# Patient Record
Sex: Female | Born: 1975 | Hispanic: Yes | Marital: Married | Smoking: Never smoker
Health system: Southern US, Community
[De-identification: ages and names within clinical notes are randomized; demographics above are authoritative.]

## PROBLEM LIST (undated history)

## (undated) DIAGNOSIS — G43909 Migraine, unspecified, not intractable, without status migrainosus: Secondary | ICD-10-CM

## (undated) DIAGNOSIS — O139 Gestational [pregnancy-induced] hypertension without significant proteinuria, unspecified trimester: Secondary | ICD-10-CM

## (undated) DIAGNOSIS — E119 Type 2 diabetes mellitus without complications: Secondary | ICD-10-CM

## (undated) DIAGNOSIS — I10 Essential (primary) hypertension: Secondary | ICD-10-CM

## (undated) DIAGNOSIS — O24419 Gestational diabetes mellitus in pregnancy, unspecified control: Secondary | ICD-10-CM

## (undated) HISTORY — DX: Essential (primary) hypertension: I10

## (undated) HISTORY — DX: Gestational (pregnancy-induced) hypertension without significant proteinuria, unspecified trimester: O13.9

## (undated) HISTORY — DX: Gestational diabetes mellitus in pregnancy, unspecified control: O24.419

## (undated) HISTORY — DX: Migraine, unspecified, not intractable, without status migrainosus: G43.909

---

## 1999-12-24 ENCOUNTER — Other Ambulatory Visit: Admission: RE | Admit: 1999-12-24 | Discharge: 1999-12-24 | Payer: Self-pay | Admitting: Obstetrics

## 2010-10-07 NOTE — L&D Delivery Note (Signed)
Delivery Note At 10:03 PM a viable female was delivered via Vaginal, Spontaneous Delivery (Presentation: occiput anterior).  APGAR: 9, 9; weight 8 lb 8.7 oz (3875 g).   Placenta status: delivered spontaneously, intact.  Cord: 3 vessels with the following complications: None.    Anesthesia: Epidural  Episiotomy: none Lacerations: none Est. Blood Loss (mL): 300 mL  Mom to postpartum.  Baby to nursery-stable.  STINSON, JACOB JEHIEL 07/30/2011, 10:20 PM

## 2010-11-02 LAB — CYTOLOGY - PAP: Pap: NEGATIVE

## 2011-02-04 LAB — HEPATITIS B SURFACE ANTIGEN: Hepatitis B Surface Ag: NEGATIVE

## 2011-02-04 LAB — HIV ANTIBODY (ROUTINE TESTING W REFLEX): HIV: NONREACTIVE

## 2011-02-04 LAB — RPR: RPR: NONREACTIVE

## 2011-02-05 ENCOUNTER — Other Ambulatory Visit (HOSPITAL_COMMUNITY): Payer: Self-pay | Admitting: Obstetrics and Gynecology

## 2011-02-05 DIAGNOSIS — O352XX0 Maternal care for (suspected) hereditary disease in fetus, not applicable or unspecified: Secondary | ICD-10-CM

## 2011-02-05 DIAGNOSIS — Z0489 Encounter for examination and observation for other specified reasons: Secondary | ICD-10-CM

## 2011-03-12 ENCOUNTER — Ambulatory Visit (HOSPITAL_COMMUNITY)
Admission: RE | Admit: 2011-03-12 | Discharge: 2011-03-12 | Disposition: A | Discharge status: Home / Self Care | Payer: Self-pay | Source: Ambulatory Visit | Attending: Obstetrics and Gynecology | Admitting: Obstetrics and Gynecology

## 2011-03-12 ENCOUNTER — Other Ambulatory Visit (HOSPITAL_COMMUNITY): Payer: Self-pay | Admitting: Obstetrics and Gynecology

## 2011-03-12 ENCOUNTER — Encounter (HOSPITAL_COMMUNITY): Payer: Self-pay

## 2011-03-12 ENCOUNTER — Ambulatory Visit (HOSPITAL_COMMUNITY)
Admission: RE | Admit: 2011-03-12 | Discharge: 2011-03-12 | Disposition: A | Discharge status: Home / Self Care | Payer: Self-pay | Source: Ambulatory Visit | Attending: *Deleted | Admitting: *Deleted

## 2011-03-12 DIAGNOSIS — O352XX0 Maternal care for (suspected) hereditary disease in fetus, not applicable or unspecified: Secondary | ICD-10-CM

## 2011-03-12 DIAGNOSIS — Z3689 Encounter for other specified antenatal screening: Secondary | ICD-10-CM | POA: Insufficient documentation

## 2011-03-12 DIAGNOSIS — E669 Obesity, unspecified: Secondary | ICD-10-CM | POA: Insufficient documentation

## 2011-03-12 DIAGNOSIS — O09299 Supervision of pregnancy with other poor reproductive or obstetric history, unspecified trimester: Secondary | ICD-10-CM | POA: Insufficient documentation

## 2011-03-12 DIAGNOSIS — O2690 Pregnancy related conditions, unspecified, unspecified trimester: Secondary | ICD-10-CM

## 2011-03-12 DIAGNOSIS — Z0489 Encounter for examination and observation for other specified reasons: Secondary | ICD-10-CM

## 2011-04-12 ENCOUNTER — Ambulatory Visit (HOSPITAL_COMMUNITY)
Admission: RE | Admit: 2011-04-12 | Discharge: 2011-04-12 | Disposition: A | Discharge status: Home / Self Care | Payer: Self-pay | Source: Ambulatory Visit | Attending: Obstetrics and Gynecology | Admitting: Obstetrics and Gynecology

## 2011-04-12 ENCOUNTER — Other Ambulatory Visit (HOSPITAL_COMMUNITY): Payer: Self-pay | Admitting: Obstetrics and Gynecology

## 2011-04-12 DIAGNOSIS — O09299 Supervision of pregnancy with other poor reproductive or obstetric history, unspecified trimester: Secondary | ICD-10-CM | POA: Insufficient documentation

## 2011-04-12 DIAGNOSIS — O2690 Pregnancy related conditions, unspecified, unspecified trimester: Secondary | ICD-10-CM

## 2011-04-12 DIAGNOSIS — E669 Obesity, unspecified: Secondary | ICD-10-CM | POA: Insufficient documentation

## 2011-04-12 DIAGNOSIS — O99210 Obesity complicating pregnancy, unspecified trimester: Secondary | ICD-10-CM | POA: Insufficient documentation

## 2011-05-14 DIAGNOSIS — F5089 Other specified eating disorder: Secondary | ICD-10-CM | POA: Insufficient documentation

## 2011-05-14 LAB — HEMOGLOBIN
ABO/RH(D): A POS
Ab Scrn Ob: NEGATIVE
GC Probe Amp, Genital: NEGATIVE
Glucose, 1 Hour GTT: 105
HIV: NEGATIVE
Hgb electrophoresis, blood: NORMAL
Rubella: IMMUNE
VZ IgM Titer Interp: IMMUNE

## 2011-05-16 ENCOUNTER — Ambulatory Visit (INDEPENDENT_AMBULATORY_CARE_PROVIDER_SITE_OTHER): Payer: Self-pay | Admitting: Family Medicine

## 2011-05-16 ENCOUNTER — Encounter: Payer: Self-pay | Admitting: Family Medicine

## 2011-05-16 VITALS — BP 122/78 | Temp 97.4°F | Ht 62.5 in | Wt 249.2 lb

## 2011-05-16 DIAGNOSIS — IMO0002 Reserved for concepts with insufficient information to code with codable children: Secondary | ICD-10-CM

## 2011-05-16 DIAGNOSIS — Z3200 Encounter for pregnancy test, result unknown: Secondary | ICD-10-CM

## 2011-05-16 DIAGNOSIS — O10019 Pre-existing essential hypertension complicating pregnancy, unspecified trimester: Secondary | ICD-10-CM

## 2011-05-16 DIAGNOSIS — F5089 Other specified eating disorder: Secondary | ICD-10-CM

## 2011-05-16 DIAGNOSIS — O169 Unspecified maternal hypertension, unspecified trimester: Secondary | ICD-10-CM | POA: Insufficient documentation

## 2011-05-16 LAB — POCT URINALYSIS DIP (DEVICE)
Bilirubin Urine: NEGATIVE
Ketones, ur: NEGATIVE mg/dL
Protein, ur: NEGATIVE mg/dL
Specific Gravity, Urine: 1.01 (ref 1.005–1.030)

## 2011-05-16 NOTE — Patient Instructions (Addendum)
Hipertensin en el embarazo Presin Arterial Elevada Durante el Embarazo (Hypertension In Pregnancy) Puede presentar presin alta (hipertensin) en la ltima mitad del embarazo (20 semanas o ms). Esto ocurre an si su presin arterial hubiera sido normal antes del embarazo. La presin alta en el embarazo puede ser seal de un trastorno llamado preeclampsia. Esto se percibe especialmente si presenta protenas en la orina. La preeclampsia puede no dar sntomas y hallarse slo en los exmenes de laboratorio y en el examen fsico. Si progresa, puede volverse eclampsia (convulsiones) o Sndrome de HELLP (anormalidades del hgado, problemas de coagulacin, mayor aumento de la presin, aumento de protenas en la orina). Ambas son enfermedades graves y Crissie Reese para la vida de la madre y del beb. Uana mujer que tiene presin alta antes de quedar embarazada tambin puede tener preclampsia sobreimpuesta. SOLICITE ATENCIN MDICA SI:  Dolor de estmago.   Hinchazn repentina y QUALCOMM, tobillos, o cara.   Aumenta de peso de 4 libras (1,8 kg) en una semana.   Vomita repetidas veces.   Sufre una hemorragia vaginal.   No percibe los movimientos del beb.   Dolor de [place].   Problemas en la visin (visin borrosa o doble).   Movimientos o espasmos musculares.   Falta de aire.   Labios o uas azulados.   Protenas en la orina.  Document Released: 09/23/2005 Document Re-Released: 03/13/2010 [facility] Patient Information 2011 [place], [place]. Psychiatrist Glass blower/designer trimestre) (Pregnancy - Third Trimester) El tercer trimestre del embarazo (los ltimos 3 meses) es el perodo de cambios ms rpidos que atraviesan usted y el beb. El aumento de peso es ms rpido. El beb alcanza un largo de aproximadamente 50 cm (20 pulgadas) y pesa entre 2,700 y 4,500 kg (6 a 10 libras). El beb gana ms tejido graso y ya est listo para la vida fuera del cuerpo de la [place]. Mientras estn en el interior,  los bebs tienen perodos de sueo y vigilia, Warehouse manager y tienen hipo. Quizs sienta pequeas contracciones del tero. Este es el falso trabajo de [place]. Tambin se las conoce como contracciones de Braxton-Hicks. Es como una prctica del parto. Los problemas ms habituales de esta etapa del embarazo incluyen mayor dificultad para respirar, hinchazn de las manos y los pies por retencin de lquidos y la necesidad de Geographical information systems officer con ms frecuencia debido a que el tero y el beb presionan sobre la vejiga.  EXAMENES PRENATALES  Durante los Manpower Inc, deber seguir realizando pruebas de [place], segn avance el [place]. Estas pruebas se realizan para controlar su salud y la del beb. Tambin se realizan anlisis de sangre para [employer] niveles de [place]. La anemia (bajo nivel de hemoglobina) es frecuente durante el embarazo. Para prevenirla, se administran hierro y vitaminas. Tambin le harn nuevas pruebas para descartar la diabetes. Podrn repetirle algunas de las Hovnanian Enterprises hicieron previamente.   En cada visita le medirn el tamao del tero. Es para asegurarse de que el beb se desarrolla correctamente.   Tambin en cada visita la pesarn. Esto se realiza para asegurarse de que aumenta de peso al ritmo indicado y que usted y su beb evolucionan normalmente.   En algunas ocasiones se realiza una ecografa para confirmar el correcto desarrollo y evolucin del beb. Esta prueba se realiza con ondas sonoras inofensivas para el beb, de modo que el profesional pueda calcular con ms precisin la fecha del [place].   Discuta las posibilidades de la anestesia si necesita cesrea.  Algunas veces  se realizan pruebas especializadas del lquido amnitico que rodea al beb. Esta prueba se denomina amniocentesis. El lquido amnitico se obtiene introduciendo una aguja en el abdomen (vientre). En ocasiones se lleva a cabo cerca del final del embarazo, si es Optician, dispensing. En  este caso se realiza para asegurarse de que los pulmones del beb estn lo suficientemente maduros como para que pueda vivir fuera del tero. CAMBIOS QUE OCURREN EN EL TERCER TRIMESTRE DEL EMBARAZO Su organismo atravesar diferentes cambios durante el embarazo que varan de Neomia Dear persona a Educational psychologist. Converse con el profesional que la asiste acerca los cambios que usted note y que la preocupen.  Durante el ltimo trimestre probablemente sienta un aumento del apetito. Es normal tener "antojos" de Development worker, community. Esto vara de Neomia Dear persona a otra y de un embarazo a Therapist, art.   Podrn aparecer las primeras estras en las caderas, abdomen y [place]. Estos son cambios normales del cuerpo durante el [place]. No existen medicamentos ni ejercicios que puedan prevenir [employer]Max.   El estreimiento puede tratarse con un laxante o agregando fibra a su dieta. Beber grandes cantidades de lquidos, tomar fibras en forma de verduras, frutas y granos integrales es de [place].   Tambin es beneficioso practicar actividad fsica. Si ha sido una persona Engineer, mining, podr continuar con la Harley[employer]Davidson de las actividades durante el mismo. Si ha sido [employer], puede ser beneficioso que comience con un programa de ejercicios, Museum/gallery exhibitions officer. Consulte con el profesional que la asiste antes de comenzar un programa de ejercicios.   Evite el consumo de cigarrillos, el alcohol, los medicamentos no prescritos y las "drogas de la calle" durante el [place]. Estas sustancias qumicas afectan la formacin y el desarrollo del beb. Evite estas sustancias durante todo el embarazo para asegurar el nacimiento de un beb sano.   Dolor de espalda, venas varicosas y hemorroides podran aparecer o empeorar.   Los movimientos del beb pueden ser ms bruscos y aparecer ms a menudo.   Puede que note dificultades para respirar facilmente.   El ombligo podra salrsele hacia afuera.   Puede segregar un lquido  amarillento (calostro) de las [place].   Puede segregar mucus con sangre. Esto normalmente ocurre unos [address] a una semana antes de que comience el [place] de [place].  INSTRUCCIONES PARA EL CUIDADO DOMICILIARIO  La mayor parte de los cuidados que se aconsejan son los mismos que los indicados para las primeras etapas del Psychiatrist. Es importante que concurra a todas las citas con el profesional y siga sus instrucciones con Camera operator a los medicamentos que deba Chemical engineer, a la actividad fsica y a Psychologist, forensic.   Durante el embarazo debe obtener nutrientes para usted y para su beb. Consuma alimentos balanceados a intervalos regulares. Elija alimentos como carne, pescado, [place] y otros productos lcteos descremados, verduras, frutas, panes integrales y cereales. El Equities trader cul es el aumento de peso ideal.   Las relaciones sexuales pueden continuarse hasta casi el final del embarazo, si no se presentan otros problemas como prdida prematura (antes de tiempo) de lquido amnitico, hemorragia vaginal o dolor abdominal (en el vientre).   Realice Tesoro Corporation, si no tiene restricciones. Consulte con el profesional que la asiste si no sabe con certeza si determinados ejercicios son seguros. El mayor aumento de peso se produce Foot [employer]ocker ltimos trimestres del [place].   Haga reposo con frecuencia, con las piernas elevadas, o segn lo necesite para evitar  los calambres y Chief Technology Officer de cintura.   Use un buen sostn o como los que se usan para hacer deportes para Paramedic la sensibilidad de las [place]. Tambin puede serle til si lo [place] mientras duerme. Si pierde Product manager, podr Parker Hann[employer]in.   No utilice la baera con agua caliente, baos turcos y saunas.   Colquese el cinturn de seguridad cuando conduzca. Este la proteger a usted y al beb en caso de accidente.   Evite comer carne cruda y el contacto con los utensilios y desperdicios de los gatos. Estos  elementos contienen grmenes que pueden causar defectos de nacimiento en el beb.   Es fcil perder algo de orina durante el [place]. Apretar y Chief Operating Officer los msculos de la pelvis la ayudar con este problema. Practique detener la miccin cuando est en el bao. Estos son los mismos msculos que Development worker, international aid. Son [employer]PP[employer] Partners mismos msculos que utiliza cuando trata de [employer]der System gases. Puede practicar apretando estos msculos WellPoint, y repetir esto tres veces por da aproximadamente. Una vez que conozca qu msculos debe contraer, no realice estos ejercicios durante la miccin. Puede favorecerle una infeccin si la orina vuelve hacia atrs.   Pida ayuda si tiene necesidades econmicas, de asesoramiento o nutricionales durante el [place]. El profesional podr ayudarla con respecto a estas necesidades, o derivarla a otros especialistas.   Practique la ida [employer] hospital a modo de [place].   Tome clases prenatales junto con su pareja para comprender, practicar y hacer preguntas acerca del Aleen Campi de parto y el nacimiento.   Prepare la habitacin del beb.   No viaje fuera de la ciudad a menos que sea absolutamente necesario y con el consejo del mdico.   Use slo zapatos bajos sin taco para tener un mejor equilibrio y prevenir cadas.  EL CONSUMO DE MEDICAMENTOS Y DROGAS DURANTE EL EMBARAZO  Contine tomando las vitaminas apropiadas para esta etapa tal como se le indic. Las vitaminas deben contener un miligramo de cido flico y deben suplementarse con hierro. Guarde todas las vitaminas fuera del alcance de los nios. La ingestin de slo un par de vitaminas o comprimidos que contengan hierro pueden ocasionar la Newmont Mining en un beb o en un nio pequeo.   Evite el uso de [place], inclusive los de venta [place], que no hayan sido prescritos o indicados por el profesional que la asiste. Algunos medicamentos pueden causar problemas fsicos al beb. Utilice los medicamentos de venta  libre o de prescripcin para Chief Technology Officer, Environmental health practitioner o la [place], segn se lo indique el profesional que lo asiste. No utilice aspirina, ibuprofeno (Motrin, Advil, Nuprin) o naproxeno (Aleve) a menos que el profesional la autorice.   El alcohol se asocia a cierto nmero de defectos del nacimiento, incluido el sndrome de alcoholismo fetal. Debe evitar el consumo de alcohol en cualquiera de sus formas. El cigarrillo causa nacimientos prematuros y bebs de bajo peso al nacer. Las drogas de la calle son muy nocivas para el beb y estn absolutamente prohibidas. Un beb que nace de American Express, ser adicto al nacer. Ese beb tendr los mismos sntomas de abstinencia que un adulto.   Infrmele al profesional si consume alguna droga.  SOLICITE ATENCIN MDICA SI: Tiene alguna preocupacin Academic librarian. Es mejor que llame para formular las preguntas si no puede esperar hasta la prxima visita, que sentirse preocupada por ellas.  DECISIONES ACERCA DE LA CIRCUNCISIN Usted puede saber o no cul es el sexo de  su beb. Si es un varn, ste es el momento de pensar acerca de la circuncisin. La circuncisin es la extirpacin del prepucio. Esta es la piel que cubre el extremo sensible del pene. No hay un motivo mdico que lo justifique. Generalmente la decisin se toma segn lo que sea popular en ese momento, o se basa en creencias religiosas. Podr conversar estos temas con el profesional que la asiste. SOLICITE ATENCIN MDICA DE INMEDIATO SI:  La temperatura oral se eleva sin motivo por encima de 101 o segn le indique el profesional que la asiste.   Tiene una prdida de lquido por la vagina (canal de parto). Si sospecha una ruptura de las [place], tmese la temperatura y llame al profesional para informarlo sobre esto.   Observa unas pequeas manchas, una hemorragia vaginal o elimina cogulos. Avsele al profesional acerca de la cantidad y de cuntos apsitos est utilizando.   Presenta un  olor desagradable en la secrecin vaginal y observa un cambio en el color, de transparente a blanco.   Ha vomitado durante ms de 24 horas.   Presenta escalofros o fiebre.   Comienza a sentir falta de aire.   Siente ardor al Beatrix Shipper.   [place] o sube ms de 900 g (ms de 2 libras), o segn lo indicado por el profesional que la asiste. Observa que sbitamente se le hinchan el rostro, las manos, los pies o las piernas.   Presenta dolor abdominal. Las molestias en el ligamento redondo son Neomia Dear causa benigna (no cancerosa) frecuente de Engineer, mining abdominal durante el Psychiatrist, pero el profesional que la asiste deber evaluarlo.   Presenta dolor de cabeza intenso que no se [place].   Si no siente los movimientos del beb durante ms de tres horas. Si piensa que el beb no se mueve tanto como lo haca habitualmente, coma algo que Psychologist, clinical y Target Corporation lado izquierdo durante [place]. El beb debe moverse al menos 4  5 veces por hora. Comunquese inmediatamente si el beb se mueve menos que lo indicado.   Se cae, se ve involucrada en un accidente automovilstico o sufre algn tipo de traumatismo.   En su hogar hay violencia mental o fsica.  Document Released: 07/03/2005 Document Re-Released: 07/21/2009 [facility] Patient Information 2011 [place], [place]. Eleccin del mtodo anticonceptivo (Birth Recruitment consultant) Los anticonceptivos son mtodos, prcticas o dispositivos para evitar que se Facilities manager en una mujer sexualmente [place].  A continuacin se indican algunos mtodos para evitar el embarazo.  Abstinencia es el mtodo ms seguro para el control de la natalidad. Requiere autocontrol. No hay riesgo de contraer enfermedades de transmisin sexual o sida.   Abstinencia peridica requiere autocontrol en ciertos perodos del mes.   Mtodo calendario, teniendo en cuenta el momento de sus perodos menstruales todos los meses.   El mtodo de ovulacin es Administrator, Civil Service relaciones  sexuales en la poca en la que est ovulando (formando un vulo).   El mtodo simptotrmico es Administrator, Civil Service relaciones sexuales en la poca en la que est ovulando con la utilizacin de un termmetro y los sntomas de la ovulacin.   El mtodo de postovulacin es tener relaciones sexuales despus de la ovulacin.  Estos mtodos no protegen contra las infecciones transmitidas sexualmente ni el sida.  Las pldoras anticonceptivas contienen estrgenos y Education officer, museum. Estos medicamentos actan impidiendo la ovulacin (la liberacin del huevo del ovario). El mdico prescribir pldoras anticonceptivas y le har preguntas acerca de los riesgos de tomarlas. No protege contra las infecciones  transmitidas sexualmente ni el sida.   La "minipldora" slo contiene progesterona. Deben tomarse todos los [address] del mes y debe prescribirlas el mdico. No protege contra las infecciones de transmisin sexual ni el sida.   Los anticonceptivos de Associate Professor, tambin llamados "la pldora del da despus" La pldora puede tomarse inmediatamente despus de Warehouse manager relaciones sexuales hasta [address] despus, si piensa que su mtodo anticonceptivo ha [place], o fue forzada a Doctor, hospital. Es ms efectiva si se toma poco tiempo despus. No use los anticonceptivos de emergencia como nico mtodo anticonceptivo. Los anticonceptivos de emergencia estn disponibles sin prescripcin mdica. Consltelo con su farmacutico.   Los condones son una vaina delgada de ltex, material sinttico o piel de cordero que se usan en el pene durante el acto sexual. Cristie Hem un esperimicida incorporado. Los condones de ltex Midwife y las enfermedades de transmisin sexual. Los condones "naturales" o de piel de cordero Development worker, international aid no protegen contra las enfermedades de transmisin sexual ni el sida.   Los condones femeninos son una vaina blanda y que se adaptan suavemente a la vagina antes de las Clinical research associate. Pueden  evitar Firefighter y las enfermedades de transmisin sexual, inclusive el sida,   La esponja es una pieza de poliuretano circular suave con espermicida que se inserta en la vagina luego de humedecerla y antes de Management consultant. No requiere prescripcin mdica. No protege contra las infecciones transmitidas sexualmente ni el sida.   El diafragma es una barrera de ltex redonda y Casimer Bilis que debe ser recomendado por un profesional. Se inserta en la vagina, junto con un gel espermicida. Luego de prepararlo, insrtelo antes de las [employer]. Debe dejar el diafragma colocado en la vagina durante 6 a 8 horas. La eliminacin y reinsercin siempre debe realizarse con un espermicida. No protege contra las infecciones transmitidas sexualmente ni el sida.   Las inyecciones de hormonas se administran cada 3 meses como mtodo anticonceptivo. Estas inyecciones contienen progesterona sinttica y no contienen estrgenos, Esta hormona impide que los ovarios liberen vulos. Tambin hacen que el moco cervical se espese y modifique el tejido de recubrimiento interno del tero. Esto hace ms difcil que los espermatozoides sobrevivan en el tero. No protege contra las infecciones transmitidas sexualmente ni el sida.   [place] para el control de la natalidad (Ortho-Evra)- Contiene hormonas similares a las de las pldoras, de modo que la efectividad, los riesgos y los efectos secundarios son los mismos. Deben cambiarse una vez por semana y se utilizan bajo prescripcin mdica. Es menos efectivo en las mujeres con sobrepeso. No protege contra las infecciones transmitidas sexualmente ni el sida.   Anillo vaginal (NuvaRing) contiene hormonas similares a las que contienen las pldoras anticonceptivas. Se deja colocado durante tres semanas, se lo retira durante una semana y luego se coloca uno nuevo. Trae un timer para colocar en el bolsillo y recordar cundo debe retirarlo y colocarse uno nuevo. Es necesario un  examen previo y la prescripcin del mdico, al igual que con las pldoras y Event organiser. No protege contra las infecciones transmitidas sexualmente ni el sida.   Lunelle es una inyeccin de estrgenos y progesterona que se administra cada 28 a 30 das. Puede aplicarse en el brazo, muslo o nalgas. No protege contra las infecciones transmitidas sexualmente o el sida.   Dispositivo intrauterino (DIU): T de cobre o T con progesterona es un dispositivo con forma de T que se coloca en el tero de la mujer durante  el perodo menstrual, para Neurosurgeon. La T de cobre dura 10 aos y el dispositivo de progesterona puede durar 5 aos. El DIU de progesterona tambin puede ayudar a Chief Operating Officer los perodos menstruales abundantes. No protege contra las infecciones transmitidas sexualmente o el sida. El DIU de T de cobre se puede Chemical engineer como dispositivo de Associate Professor, si se inserta dentro de los [address] de tener relaciones sexuales sin proteccin.   El capuchn cervical es una barrera de ltex o taza plstica redonda y [place] que cubre el cuello del tero y debe ser colocada por un mdico. No necesitar Chemical engineer un espermicida para colocarlo o retirarlo cada vez que tiene Clinical research associate. No protege contra las infecciones transmitidas sexualmente ni el sida.   Los espermicidas son qumicos que matan o bloquean el esperma y no lo dejan ingresar al cuello del tero y al tero. Vienen en forma de cremas, geles, supositorios, espuma o pastillas, y no requieren prescripcin. Se insertan en la vagina con un aplicador antes de Management consultant. Esto debe repetirse cada vez que tiene [employer].   El retiro es un mtodo en el que el hombre retira el pene durante las relaciones sexuales antes de llegar al clmax y depositar el esperma. No protege contra las infecciones transmitidas sexualmente o el sida.   La ligadura de trompas en la mujer se realiza sellando quirrgicamente las trompas de [place]  lo que impide que el vulo descienda hacia el tero. No protege contra las infecciones transmitidas sexualmente ni el sida.   La esterilizacin masculina es cuando al hombre se le atan los conductos quirrgicamente (vasectoma) para que el esperma no ingrese a la vagina durante las Clinical research associate. No protege contra las infecciones transmitidas sexualmente ni el sida.  Independientemente del mtodo anticonceptivo que usted elija, es importante que utilice alguna forma de proteccin contra las infecciones que se transmiten sexualmente. Document Released: 09/23/2005 Document Re-Released: 03/13/2010 [facility] Patient Information 2011 [place], [place]. Amamantar al beb (Breastfeeding Your Baby) LOS BENEFICIOS DE AMAMANTAR Para el beb  La primera leche (calostro ) ayuda al mejor funcionamiento del sistema digestivo del beb.   La leche tiene anticuerpos que provienen de la madre y que ayudan a prevenir las infecciones en el beb.   Hay una menor incidencia de asma, enfermedades alrgicas y SMSI (sndrome de muerte sbita nfantil).   Los nutrientes que contiene la [place] materna son mejores que las frmulas para el bibern y favorecen el desarrollo cerebral.   Los bebs amamantados sufren menos gases, clicos y constipacin.  Para la mam  La lactancia materna favorece el desarrollo de un vnculo muy especial entre la madre y el beb.   Es ms conveniente, siempre disponible a la Optician, dispensing y ms econmica que la [employer].   Consume caloras en la madre y la ayuda a perder el peso ganado durante el [place].   Favorece la contraccin del tero a su tamao normal, de manera ms rpida y [employer] las hemorragias luego del [place].   Las [employer].C[employer] Holdings que amamantan tienen menor riesgo de Geophysical data processor de mama.  AMAMNTELO CON FRECUENCIA  Un beb sano, nacido a trmino, puede amamantarse con tanta frecuencia como cada hora, o espaciar las comidas cada tres horas.   Esta  frecuencia variar de un beb a otro. Observe al beb cuando manifieste signos de hambre, antes que regirse por el reloj.   Amamntelo tan seguido como el beb lo solicite, o cuando usted sienta la necesidad de Paramedic sus  mamas.   Despierte al beb si han pasado 3  4 horas desde la ltima comida.   El amamantamiento frecuente la ayudar a producir ms [place] y a Education officer, community de Engineer, mining en los pezones e hinchazn de las [place].  LA POSICIN DEL BEB PARA AMAMANTARLO  Ya sea que se encuentre acostada o sentada, asegrese que el abdomen del beb enfrente el suyo.   Sostenga la mama con el pulgar por arriba y el resto de los dedos por debajo. Asegrese que sus dedos se encuentren lejos del pezn y de la boca del beb.   Toque suavemente los labios del beb y la mejilla ms cercana a la mama con el dedo o el pezn.   Cuando la boca del beb se abra lo suficiente, introduzca el pezn y la zona oscura que lo rodea tanto como le sea posible dentro de la boca.   Coloque a beb cerca suyo de modo que su nariz y mejillas toquen las mamas al [employer].  LAS COMIDAS  La duracin de cada comida vara de un beb a otro y de [place] comida a [place].   El beb debe succionar alrededor American Financial o tres minutos para que le llegue [place]. Esto se denomina "bajada". Por este motivo, permita que el nio se alimente en cada mama todo lo que desee. Terminar de mamar cuando haya recibido la cantidad [place] de nutrientes.   Para detener la succin coloque su dedo en la comisura de la boca del nio y Midwife entre sus encas antes de quitarle la mama de la boca. Esto la ayudar a English as a second language teacher.  REDUCIR LA CONGESTIN DE LAS MAMAS  Durante la primera semana despus del parto, usted puede experimentar Monsanto Company. Cuando las mamas estn congestionadas, se sienten calientes, llenas y molestas al tacto. Puede reducir la congestin si:   Lo amamanta frecuentemente, cada 2-3 horas. Las mams que  [employer] pronto y con frecuencia tienen menos problemas de [place].   Coloque bolsas fras livianas (las bolsas de maz o guisantes congelados son tiles) entre cada [place]. Esto ayuda a Building services engineer. Envuelva las bolsas de hielo en una toalla liviana para proteger su piel.   Aplique compresas hmedas calientes [employer] durante 5 a 10 minutos antes de amamantar al [employer]. Esto aumenta la circulacin y [place] a que la [place].   Masajee suavemente la mama antes y Psychologist, sport and exercise.   Asegrese que el nio vaca al menos una mama antes de cambiar de lado.   Use un sacaleche para vaciar la mama si el beb se duerme o no se alimenta bien. Tambin podr P[employer] con esta bomba si tiene que volver al trabajo o siente que las mamas estn congestionadas.   Evite los biberones, chupetes o complementar la alimentacin con agua o jugos en lugar de la [place].   Verifique que el beb se encuentra en la posicin correcta mientras lo alimenta.   Evite el cansancio, el estrs y la anemia   Use un soutien que sostenga bien sus mamas y evite los que tienen aro.   Consuma una dieta balanceada y beba lquidos en cantidad.  Si sigue estas indicaciones, la congestin debe mejorar en 24 a 48 horas. Si an tiene dificultades, consulte a Barista. TENDR SUFICIENTE LECHE MI BEB? Algunas veces las madres se preocupan acerca de si sus bebs tendrn la leche suficiente. Puede asegurarse que el beb tiene la [place]  suficiente si:  El beb succiona y escucha que traga activamente.   El nio se alimenta al menos 8 a 12 veces en 24 horas. Alimntelo hasta que se desprenda por sus propios medios o se quede dormido en la primera mama (al menos durante 10 a 20 minutos), luego ofrzcale el otro lado.   El beb moja 5 a 6 paales descartables (6 a 8 paales de tela) en 24 horas cuando tiene 5  6 das de vida.   Tiene al menos 2-3 deposiciones todos los [employer]  primeros meses. La leche materna es todo el alimento que el beb necesita. No es necesario que el nio ingiera agua o preparados de bibern. De hecho, para ayudar a que sus mamas produzcan ms [place], lo mejor es no darle al beb suplementos durante las primeras semanas.   La materia fecal debe ser blanda y [place].   El beb debe aumentar 4 a 6 libras (120 gr. a 170 gr. por semana.  CUDESE Cuide sus mamas del siguiente modo:  Bese o dchese diariamente.   No lave sus pezones con jabn.   Comience a amamantar del lado izquierdo en una comida y del lado derecho en la siguiente.   Notar que [employer] suministro de [place] a los 2 a 5 [address]  [address] despus del [place]. Puede sentir algunas molestias por la congestin, lo que hace que sus mamas estn duras y sensibles. La congestin disminuye en 24 a 48 horas. Mientras tanto, aplique toallas hmedas calientes durante 5 a 10 minutos antes de amamantar. Un masaje suave y la extraccin de un poco de leche antes de Museum/gallery exhibitions officer ablandarn las mamas y har ms fcil que el beb se agarre. Use un buen sostn y seque al aire los pezones durante 10 a 15 minutos luego de cada alimentacin.   Solo utilice apsitos de algodn.   Utilice lanolina [employer] pezones luego de [place]. No necesita lavarlos luego de alimentar al [employer].  Cudese del siguiente modo:   Consuma alimentos bien balanceados y refrigerios nutritivos.   Dixie Dials, jugos de fruta y agua para Warehouse manager sed (alrededor de 8 vasos por Futures trader).   Descanse lo suficiente.   Aumente la ingesta de calcio en la dieta (1200mg /da).   Evite los alimentos que usted nota que puedan afectar al beb.  SOLICITE ATENCIN MDICA SI:  Tiene preguntas que formular o dificultades con la alimentacin a pecho.   Necesita ayuda.   Observa una zona dura, roja y que le duele en la zona de la mama, y se acompaa de fiebre de 100.5 F (38.1 C) o ms.   El beb est muy somnoliento como para alimentarse  bien o tiene problemas para dormir.   El beb moja menos de 6 paales por da, a partir de los [address] de [place].   La piel del beb o la parte blanca de sus ojos est ms amarilla de lo que estaba en el hospital.   Se siente deprimida.  Document Released: 09/23/2005 Document Re-Released: 12/18/2009 [facility] Patient Information 2011 [place], [place].

## 2011-05-16 NOTE — Progress Notes (Signed)
  Subjective:    Jeanette [name] is a 35 y.o. G3P2002 [redacted]w[redacted]d being seen today for her obstetrical visit.  Patient reports no complaints. Fetal movement: normal.  She is referred from [facility] for HTN.     Objective:    BP 122/78  Temp 97.4 F (36.3 C)  Ht 5' 2.5" (1.588 m)  Wt 249 lb 3.2 oz (113.036 kg)  BMI 44.85 kg/m2  LMP 10/30/2010  Physical Exam  Exam  FHT:  135 BPM  Uterine Size: 31 cm  Presentation: unsure     Assessment:    Pregnancy:  [id number] HTN     Plan:    Patient Active Problem List  Diagnoses  . Pica  . Hypertension complicating pregnancy  U/S growth   Follow up in 2 Weeks.

## 2011-05-16 NOTE — Progress Notes (Signed)
Pt has already had 28 week labs at HD in [facility]. Pt thinks she has a yeast infection, having thick white discharge. Used spanish interpreter [employer]

## 2011-05-24 ENCOUNTER — Ambulatory Visit (HOSPITAL_COMMUNITY)
Admission: RE | Admit: 2011-05-24 | Discharge: 2011-05-24 | Disposition: A | Discharge status: Home / Self Care | Payer: Self-pay | Source: Ambulatory Visit | Attending: Obstetrics and Gynecology | Admitting: Obstetrics and Gynecology

## 2011-05-24 ENCOUNTER — Other Ambulatory Visit (HOSPITAL_COMMUNITY): Payer: Self-pay | Admitting: Obstetrics and Gynecology

## 2011-05-24 DIAGNOSIS — O09299 Supervision of pregnancy with other poor reproductive or obstetric history, unspecified trimester: Secondary | ICD-10-CM | POA: Insufficient documentation

## 2011-05-24 DIAGNOSIS — E669 Obesity, unspecified: Secondary | ICD-10-CM | POA: Insufficient documentation

## 2011-05-24 DIAGNOSIS — O2690 Pregnancy related conditions, unspecified, unspecified trimester: Secondary | ICD-10-CM

## 2011-05-24 DIAGNOSIS — O99210 Obesity complicating pregnancy, unspecified trimester: Secondary | ICD-10-CM | POA: Insufficient documentation

## 2011-05-24 NOTE — Progress Notes (Signed)
Patient seen for ultrasound only appointment today.  Please see [facility]-OBGY[facility] report for details.  

## 2011-05-30 ENCOUNTER — Ambulatory Visit (INDEPENDENT_AMBULATORY_CARE_PROVIDER_SITE_OTHER): Payer: Self-pay | Admitting: Family Medicine

## 2011-05-30 VITALS — BP 114/78 | Temp 97.3°F | Wt 252.8 lb

## 2011-05-30 DIAGNOSIS — F5089 Other specified eating disorder: Secondary | ICD-10-CM

## 2011-05-30 DIAGNOSIS — O0990 Supervision of high risk pregnancy, unspecified, unspecified trimester: Secondary | ICD-10-CM

## 2011-05-30 DIAGNOSIS — O169 Unspecified maternal hypertension, unspecified trimester: Secondary | ICD-10-CM

## 2011-05-30 LAB — POCT URINALYSIS DIP (DEVICE)
Bilirubin Urine: NEGATIVE
Nitrite: NEGATIVE
pH: 6 (ref 5.0–8.0)

## 2011-05-30 NOTE — Progress Notes (Signed)
Pt without complaint.  Denies frequency, hesitancy, dysuria, headache, vision changes.  No vagina bleeding.  Will send urine culture for large leukocytes.  F/u in 2 weeks.

## 2011-05-30 NOTE — Progress Notes (Signed)
No vaginal discharge. 

## 2011-06-01 LAB — URINE CULTURE

## 2011-06-13 ENCOUNTER — Encounter: Payer: Self-pay | Admitting: Family Medicine

## 2011-06-13 ENCOUNTER — Ambulatory Visit (INDEPENDENT_AMBULATORY_CARE_PROVIDER_SITE_OTHER): Payer: Self-pay | Admitting: Family Medicine

## 2011-06-13 DIAGNOSIS — O10019 Pre-existing essential hypertension complicating pregnancy, unspecified trimester: Secondary | ICD-10-CM

## 2011-06-13 DIAGNOSIS — O169 Unspecified maternal hypertension, unspecified trimester: Secondary | ICD-10-CM

## 2011-06-13 DIAGNOSIS — F5089 Other specified eating disorder: Secondary | ICD-10-CM

## 2011-06-13 DIAGNOSIS — G43909 Migraine, unspecified, not intractable, without status migrainosus: Secondary | ICD-10-CM | POA: Insufficient documentation

## 2011-06-13 HISTORY — DX: Migraine, unspecified, not intractable, without status migrainosus: G43.909

## 2011-06-13 LAB — POCT URINALYSIS DIP (DEVICE)
Bilirubin Urine: NEGATIVE
Glucose, UA: NEGATIVE mg/dL
Ketones, ur: NEGATIVE mg/dL
pH: 7 (ref 5.0–8.0)

## 2011-06-13 MED ORDER — CYCLOBENZAPRINE HCL 5 MG PO TABS: 5.0000 mg | ORAL_TABLET | Freq: Three times a day (TID) | ORAL | Status: DC | PRN

## 2011-06-13 NOTE — Patient Instructions (Signed)
Hipertensin en el embarazo Presin Arterial Elevada Durante el Embarazo (Hypertension In Pregnancy) Puede presentar presin alta (hipertensin) en la ltima mitad del embarazo (20 semanas o ms). Esto ocurre an si su presin arterial hubiera sido normal antes del embarazo. La presin alta en el embarazo puede ser seal de un trastorno llamado preeclampsia. Esto se percibe especialmente si presenta protenas en la orina. La preeclampsia puede no dar sntomas y hallarse slo en los exmenes de laboratorio y en el examen fsico. Si progresa, puede volverse eclampsia (convulsiones) o Sndrome de HELLP (anormalidades del hgado, problemas de coagulacin, mayor aumento de la presin, aumento de protenas en la orina). Ambas son enfermedades graves y Crissie Reese para la vida de la madre y del beb. Uana mujer que tiene presin alta antes de quedar embarazada tambin puede tener preclampsia sobreimpuesta. SOLICITE ATENCIN MDICA SI:  Dolor de estmago.   Hinchazn repentina y [employer], tobillos, o cara.   Aumenta de peso de 4 libras (1,8 kg) en una semana.   Vomita repetidas veces.   Sufre una hemorragia vaginal.   No percibe los movimientos del beb.   Dolor de [place].   Problemas en la visin (visin borrosa o doble).   Movimientos o espasmos musculares.   Falta de aire.   Labios o uas azulados.   Protenas en la orina.  Document Released: 09/23/2005 Document Re-Released: 03/13/2010 [facility] Patient Information 2011 [place], [place]. Psychiatrist Glass blower/designer trimestre) (Pregnancy - Third Trimester) El tercer trimestre del embarazo (los ltimos 3 meses) es el perodo de cambios ms rpidos que atraviesan usted y el beb. El aumento de peso es ms rpido. El beb alcanza un largo de aproximadamente 50 cm (20 pulgadas) y pesa entre 2,700 y 4,500 kg (6 a 10 libras). El beb gana ms tejido graso y ya est listo para la vida fuera del cuerpo de la [place]. Mientras estn en el interior,  los bebs tienen perodos de sueo y vigilia, Warehouse manager y tienen hipo. Quizs sienta pequeas contracciones del tero. Este es el falso trabajo de [place]. Tambin se las conoce como contracciones de [place]. Es como una prctica del parto. Los problemas ms habituales de esta etapa del embarazo incluyen mayor dificultad para respirar, hinchazn de las manos y los pies por retencin de lquidos y la necesidad de Geographical information systems officer con ms frecuencia debido a que el tero y el beb presionan sobre la vejiga.  EXAMENES PRENATALES  Durante los [employer]power [employer], deber seguir realizando pruebas de [place], segn avance el [place]. Estas pruebas se realizan para controlar su salud y la del beb. Tambin se realizan anlisis de sangre para [employer] niveles de [place]. La anemia (bajo nivel de hemoglobina) es frecuente durante el embarazo. Para prevenirla, se administran hierro y vitaminas. Tambin le harn nuevas pruebas para descartar la diabetes. Podrn repetirle algunas de las Hov[employer] Enterprises hicieron previamente.   En cada visita le medirn el tamao del tero. Es para asegurarse de que el beb se desarrolla correctamente.   Tambin en cada visita la pesarn. Esto se realiza para asegurarse de que aumenta de peso al ritmo indicado y que usted y su beb evolucionan normalmente.   En algunas ocasiones se realiza una ecografa para confirmar el correcto desarrollo y evolucin del beb. Esta prueba se realiza con ondas sonoras inofensivas para el beb, de modo que el profesional pueda calcular con ms precisin la fecha del [place].   Discuta las posibilidades de la anestesia si necesita cesrea.  Algunas veces  se realizan pruebas especializadas del lquido amnitico que rodea al beb. Esta prueba se denomina amniocentesis. El lquido amnitico se obtiene introduciendo una aguja en el abdomen (vientre). En ocasiones se lleva a cabo cerca del final del embarazo, si es Optician, dispensing. En  este caso se realiza para asegurarse de que los pulmones del beb estn lo suficientemente maduros como para que pueda vivir fuera del tero. CAMBIOS QUE OCURREN EN EL TERCER TRIMESTRE DEL EMBARAZO Su organismo atravesar diferentes cambios durante el embarazo que varan de Neomia Dear persona a Educational psychologist. [place] con el profesional que la asiste acerca los cambios que usted note y que la preocupen.  Durante el ltimo trimestre probablemente sienta un aumento del apetito. Es normal tener "antojos" de Development worker, community. Esto vara de Neomia Dear persona a otra y de un embarazo a Therapist, art.   Podrn aparecer las primeras estras en las caderas, abdomen y [place]. Estos son cambios normales del cuerpo durante el [place]. No existen medicamentos ni ejercicios que puedan prevenir [employer].   El estreimiento puede tratarse con un laxante o agregando fibra a su dieta. Beber grandes cantidades de lquidos, tomar fibras en forma de verduras, frutas y granos integrales es de [place].   Tambin es beneficioso practicar actividad fsica. Si ha sido una persona Engineer, mining, podr continuar con la Har[employer]son de las actividades durante el mismo. Si ha sido [employer], puede ser beneficioso que comience con un programa de ejercicios, Museum/gallery exhibitions officer. Consulte con el profesional que la asiste antes de comenzar un programa de ejercicios.   Evite el consumo de cigarrillos, el alcohol, los medicamentos no prescritos y las "drogas de la calle" durante el [place]. Estas sustancias qumicas afectan la formacin y el desarrollo del beb. Evite estas sustancias durante todo el embarazo para asegurar el nacimiento de un beb sano.   Dolor de espalda, venas varicosas y hemorroides podran aparecer o empeorar.   Los movimientos del beb pueden ser ms bruscos y aparecer ms a menudo.   Puede que note dificultades para respirar facilmente.   El ombligo podra salrsele hacia afuera.   Puede segregar un lquido  amarillento (calostro) de las [place].   Puede segregar mucus con sangre. Esto normalmente ocurre unos [address] a una semana antes de que comience el [place] de [place].  INSTRUCCIONES PARA EL CUIDADO DOMICILIARIO  La mayor parte de los cuidados que se aconsejan son los mismos que los indicados para las primeras etapas del Psychiatrist. Es importante que concurra a todas las citas con el profesional y siga sus instrucciones con Camera operator a los medicamentos que deba Chemical engineer, a la actividad fsica y a Psychologist, forensic.   Durante el embarazo debe obtener nutrientes para usted y para su beb. Consuma alimentos balanceados a intervalos regulares. Elija alimentos como carne, pescado, [place] y otros productos lcteos descremados, verduras, frutas, panes integrales y cereales. El Equities trader cul es el aumento de peso ideal.   Las relaciones sexuales pueden continuarse hasta casi el final del embarazo, si no se presentan otros problemas como prdida prematura (antes de tiempo) de lquido amnitico, hemorragia vaginal o dolor abdominal (en el vientre).   Realice Tesoro Corporation, si no tiene restricciones. Consulte con el profesional que la asiste si no sabe con certeza si determinados ejercicios son seguros. El mayor aumento de peso se produce Foot Locker ltimos trimestres del [place].   Haga reposo con frecuencia, con las piernas elevadas, o segn lo necesite para evitar  los calambres y Chief Technology Officer de cintura.   Use un buen sostn o como los que se usan para hacer deportes para Paramedic la sensibilidad de las [place]. Tambin puede serle til si lo [place] mientras duerme. Si pierde Product manager, podr Parker Hann[employer]in.   No utilice la baera con agua caliente, baos turcos y saunas.   Colquese el cinturn de seguridad cuando conduzca. Este la proteger a usted y al beb en caso de accidente.   Evite comer carne cruda y el contacto con los utensilios y desperdicios de los gatos. Estos  elementos contienen grmenes que pueden causar defectos de nacimiento en el beb.   Es fcil perder algo de orina durante el [place]. Apretar y Chief Operating Officer los msculos de la pelvis la ayudar con este problema. Practique detener la miccin cuando est en el bao. Estos son los mismos msculos que Development worker, international aid. Son [employer] Partners mismos msculos que utiliza cuando trata de Ryder System gases. Puede practicar apretando estos msculos WellPoint, y repetir esto tres veces por da aproximadamente. Una vez que conozca qu msculos debe contraer, no realice estos ejercicios durante la miccin. Puede favorecerle una infeccin si la orina vuelve hacia atrs.   Pida ayuda si tiene necesidades econmicas, de asesoramiento o nutricionales durante el [place]. El profesional podr ayudarla con respecto a estas necesidades, o derivarla a otros especialistas.   Practique la ida [employer] hospital a modo de [place].   Tome clases prenatales junto con su pareja para comprender, practicar y hacer preguntas acerca del Aleen Campi de parto y el nacimiento.   Prepare la habitacin del beb.   No viaje fuera de la ciudad a menos que sea absolutamente necesario y con el consejo del mdico.   Use slo zapatos bajos sin taco para tener un mejor equilibrio y prevenir cadas.  EL CONSUMO DE MEDICAMENTOS Y DROGAS DURANTE EL EMBARAZO  Contine tomando las vitaminas apropiadas para esta etapa tal como se le indic. Las vitaminas deben contener un miligramo de cido flico y deben suplementarse con hierro. Guarde todas las vitaminas fuera del alcance de los nios. La ingestin de slo un par de vitaminas o comprimidos que contengan hierro pueden ocasionar la Newmont Mining en un beb o en un nio pequeo.   Evite el uso de [place], inclusive los de venta [place], que no hayan sido prescritos o indicados por el profesional que la asiste. Algunos medicamentos pueden causar problemas fsicos al beb. Utilice los medicamentos de venta  libre o de prescripcin para Chief Technology Officer, Environmental health practitioner o la [place], segn se lo indique el profesional que lo asiste. No utilice aspirina, ibuprofeno (Motrin, Advil, Nuprin) o naproxeno (Aleve) a menos que el profesional la autorice.   El alcohol se asocia a cierto nmero de defectos del nacimiento, incluido el sndrome de alcoholismo fetal. Debe evitar el consumo de alcohol en cualquiera de sus formas. El cigarrillo causa nacimientos prematuros y bebs de bajo peso al nacer. Las drogas de la calle son muy nocivas para el beb y estn absolutamente prohibidas. Un beb que nace de [employer] Express, ser adicto al nacer. Ese beb tendr los mismos sntomas de abstinencia que un adulto.   Infrmele al profesional si consume alguna droga.  SOLICITE ATENCIN MDICA SI: Tiene alguna preocupacin Academic librarian. Es mejor que llame para formular las preguntas si no puede esperar hasta la prxima visita, que sentirse preocupada por ellas.  DECISIONES ACERCA DE LA CIRCUNCISIN Usted puede saber o no cul es el sexo de  su beb. Si es un varn, ste es el momento de pensar acerca de la circuncisin. La circuncisin es la extirpacin del prepucio. Esta es la piel que cubre el extremo sensible del pene. No hay un motivo mdico que lo justifique. Generalmente la decisin se toma segn lo que sea popular en ese momento, o se basa en creencias religiosas. Podr conversar estos temas con el profesional que la asiste. SOLICITE ATENCIN MDICA DE INMEDIATO SI:  La temperatura oral se eleva sin motivo por encima de 101 o segn le indique el profesional que la asiste.   Tiene una prdida de lquido por la vagina (canal de parto). Si sospecha una ruptura de las [place], tmese la temperatura y llame al profesional para informarlo sobre esto.   Observa unas pequeas manchas, una hemorragia vaginal o elimina cogulos. Avsele al profesional acerca de la cantidad y de cuntos apsitos est utilizando.   Presenta un  olor desagradable en la secrecin vaginal y observa un cambio en el color, de transparente a blanco.   Ha vomitado durante ms de 24 horas.   Presenta escalofros o fiebre.   Comienza a sentir falta de aire.   Siente ardor al Beatrix Shipper.   [place] o sube ms de 900 g (ms de 2 libras), o segn lo indicado por el profesional que la asiste. Observa que sbitamente se le hinchan el rostro, las manos, los pies o las piernas.   Presenta dolor abdominal. Las molestias en el ligamento redondo son Neomia Dear causa benigna (no cancerosa) frecuente de Engineer, mining abdominal durante el Psychiatrist, pero el profesional que la asiste deber evaluarlo.   Presenta dolor de cabeza intenso que no se [place].   Si no siente los movimientos del beb durante ms de tres horas. Si piensa que el beb no se mueve tanto como lo haca habitualmente, coma algo que Psychologist, clinical y Target Corporation lado izquierdo durante [place]. El beb debe moverse al menos 4  5 veces por hora. Comunquese inmediatamente si el beb se mueve menos que lo indicado.   Se cae, se ve involucrada en un accidente automovilstico o sufre algn tipo de traumatismo.   En su hogar hay violencia mental o fsica.  Document Released: 07/03/2005 Document Re-Released: 07/21/2009 [facility] Patient Information 2011 [place], [place].

## 2011-06-13 NOTE — Progress Notes (Signed)
U/S scheduled next week--start 2x/wk testing.  C/o of migraine headache--trial of flexeril. Subjective:    Jeanette [name] is a 35 y.o. G3P2002 [redacted]w[redacted]d being seen today for her obstetrical visit.  Patient reports no complaints. Fetal movement: normal.  Objective:    BP 115/74  Temp 97.3 F (36.3 C)  Wt 253 lb 12.8 oz (115.123 kg)  LMP 10/30/2010  Physical Exam  Exam  FHT:  150 BPM  Uterine Size: 32 cm  Presentation: unsure     Assessment:    Pregnancy:  [id number]    Plan:    Patient Active Problem List  Diagnoses  . Pica  . Hypertension complicating pregnancy    Start 2x/wk testing, U/S growth Follow up in twice weekly.

## 2011-06-13 NOTE — Progress Notes (Signed)
Swelling in feet. Some pain when moving. No vaginal discharge. Pulse 101.

## 2011-06-17 ENCOUNTER — Ambulatory Visit (INDEPENDENT_AMBULATORY_CARE_PROVIDER_SITE_OTHER): Payer: Self-pay | Admitting: *Deleted

## 2011-06-17 DIAGNOSIS — O169 Unspecified maternal hypertension, unspecified trimester: Secondary | ICD-10-CM

## 2011-06-17 DIAGNOSIS — F5089 Other specified eating disorder: Secondary | ICD-10-CM

## 2011-06-17 NOTE — Progress Notes (Signed)
P = 100  NST today

## 2011-06-20 ENCOUNTER — Other Ambulatory Visit: Payer: Self-pay | Admitting: Family Medicine

## 2011-06-20 ENCOUNTER — Ambulatory Visit (INDEPENDENT_AMBULATORY_CARE_PROVIDER_SITE_OTHER): Payer: Self-pay | Admitting: Family Medicine

## 2011-06-20 ENCOUNTER — Ambulatory Visit (HOSPITAL_COMMUNITY)
Admission: RE | Admit: 2011-06-20 | Discharge: 2011-06-20 | Disposition: A | Discharge status: Home / Self Care | Payer: Self-pay | Source: Ambulatory Visit | Attending: Obstetrics and Gynecology | Admitting: Obstetrics and Gynecology

## 2011-06-20 ENCOUNTER — Other Ambulatory Visit (HOSPITAL_COMMUNITY): Payer: Self-pay | Admitting: Obstetrics and Gynecology

## 2011-06-20 VITALS — BP 127/82 | Temp 97.0°F | Wt 253.9 lb

## 2011-06-20 DIAGNOSIS — O2690 Pregnancy related conditions, unspecified, unspecified trimester: Secondary | ICD-10-CM

## 2011-06-20 DIAGNOSIS — E669 Obesity, unspecified: Secondary | ICD-10-CM | POA: Insufficient documentation

## 2011-06-20 DIAGNOSIS — O09299 Supervision of pregnancy with other poor reproductive or obstetric history, unspecified trimester: Secondary | ICD-10-CM | POA: Insufficient documentation

## 2011-06-20 DIAGNOSIS — O99210 Obesity complicating pregnancy, unspecified trimester: Secondary | ICD-10-CM | POA: Insufficient documentation

## 2011-06-20 DIAGNOSIS — O10019 Pre-existing essential hypertension complicating pregnancy, unspecified trimester: Secondary | ICD-10-CM

## 2011-06-20 DIAGNOSIS — O139 Gestational [pregnancy-induced] hypertension without significant proteinuria, unspecified trimester: Secondary | ICD-10-CM | POA: Insufficient documentation

## 2011-06-20 DIAGNOSIS — O34219 Maternal care for unspecified type scar from previous cesarean delivery: Secondary | ICD-10-CM

## 2011-06-20 NOTE — Progress Notes (Signed)
U/s growth today.  2x/wk testing Subjective:    Jeanette [name] is a 35 y.o. [id number] [redacted]w[redacted]d being seen today for her obstetrical visit.  Patient reports no complaints. Fetal movement: normal.  Objective:    BP 127/82  Temp 97 F (36.1 C)  Wt 253 lb 14.4 oz (115.168 kg)  LMP 10/30/2010  Physical Exam  Exam  FHT:  150 BPM  Uterine Size: 38 cm, obese  Presentation: unsure     Assessment:    Pregnancy:  [id number]    Plan:    Patient Active Problem List  Diagnoses  . Pica  . Hypertension complicating pregnancy  . Migraine headache    Doing well, BP is good, U/S growth today Follow up in twice weekly.

## 2011-06-20 NOTE — Patient Instructions (Signed)
Parto vaginal luego de una cesrea (Vaginal Birth After Cesarean Section, VBAC) Un parto vaginal luego de un parto por cesrea es dar a luz por la vagina luego de haber dado a luz por medio de una intervencin [place]. En el pasado, si una mujer tena un beb por cesrea, todos los partos posteriores deban hacerse por cesrea. Esto ya no es as. Puede ser seguro para la mam intentar un parto vaginal luego de una cesrea. La decisin final de tener un parto vaginal o por cesrea debe tomarse en conjunto, entre la paciente y el mdico. [place] riesgos y los beneficios debern evaluarse con relacin a los motivos y al tipo de cesrea previa LAS MUJERES QUE QUIEREN TENER UN PARTO VAGINAL, DEBEN CONSULTAR CON SU MDICO PARA ASEGURARSE QUE:  La cesrea anterior se realiz con una incisin uterina transversal (no con una incisin vertical clsica).   El canal de parto es lo suficientemente grande como para que pase el [place].   No ha sido sometida a otras operaciones del tero.   Durante el trabajo de parto le realizarn un monitoreo electrnico fetal, en todo momento.   Es necesario que haya un quirfano disponible y listo en caso de necesitar una cesrea de emergencia.   Un cirujano y personal de quirfano estarn disponibles en todo momento durante el [place] de parto, para realizar una cesrea en caso de ser necesario.   Habr un anestesista disponible en caso de necesitar una cesrea de emergencia.   La nursery est lista cuenta con personal especializado y el equipo disponible para cuidar al beb en caso de emergencia.  BENEFICIOS:  Permanencia ms breve en el hospital.   Menores costos en el parto, la nurse y el hospital.   Menos prdida de sangre y menos probabilidad de necesitar una transfusin sangunea.   Menos probabilidad de tener fiebre o molestias como consecuencia de una ciruga mayor   Menos riesgo de cogulos sanguneos.   Menos riesgo de sufrir infecciones.   Recuperacin  ms rpida luego del alta mdica.   Menos riesgo de complicaciones quirrgicas, como apertura o hernia de la incisin.   Disminucin del riesgo de lesiones a otros rganos   Menor riesgo de remocin del tero (histerectoma)   Menor riesgo de que la placenta cubra parcial o completamente la abertura del tero (placenta previa) en embarazos futuros   Posibilidad de tener una familia grande, si lo desea.  RIESGOS  Ruptura del tero.   Si el tero se rompe Production manager.   Todas las complicaciones de [place] mayor y lesiones en otros rganos.   Hemorragia excesiva, cogulos e infeccin.   Lower Apgar Puntuacin Apgar baja (mtodo que evala al recin nacido segn su apariencia, pulso, muecas, actividad y respiracion) y ms riesgos para el beb.   Hay mas riesgo de ruptura del tero si se induce o aumenta el trabajo de [place].   Hay un mayor riesgo de ruptura uterina si se usan medicamentos para madurar el cuello.  NO DEBE LLEVARSE A CABO SI:  La cesrea previa se realiz con una incicin vertical (clsica) o con forma de T, o usted no sabe cul de [employer] han practicado.   Ha sufrido ruptura del tero.   Le han practicado una ciruga de tero.   Tiene problemas mdicos u obsttricos.   El beb est en problemas.   Tuvo dos cesreas previas y ningn parto vaginal.  OTRAS COSAS QUE DEBE SABER:  La anestesia peridural es segura.  Es seguro dar vuelta al beb si se encuentra de nalgas (intentar una versin ceflica externa).   Es seguro intentarlo en caso de mellizos.   Los embarazos de ms de 40 semanas no tienen xito con este procedimiento.   Hay un aumento de fracasos en embarazadas obesas.   Hay un aumento del porcentaje de fracasos si el beb pesa 4 Kg o ms.   Hay aumento en el porcentaje de fracasos si el intervalo entre la operacin cesrea y el parto vaginal es de menos de 19 meses.   Hay un aumento en el porcentaje de fracasos si ha  sufrido preeclampsia hipertensin arterial, protenas en la orina e hinchazn del rostro y las extremidades.   El parto vaginal ser muy exitoso si tuvo un parto vaginal previo.   Tambin es [employer] caso que el [place] de parto comience espontneamente antes de la fecha.   El parto vaginal luego de Neomia Dear cesrea es similar a un parto espontneo vaginal normal.  Es importante que converse con su mdico desde comienzos del Psychiatrist de modo que pueda [employer], beneficios y opciones. De este modo tendr tiempo de decidir que es lo mejor en su caso particular en relacin a su parto por cesrea anterior. Hay que tener en cuenta que puede haber cambios en la madre durante el [place], lo que hace necesario cambiar su decisin o la del mdico. Los consejos, preocupaciones y decisiones debern documentarse en la historia clnica y debe ser firmada por todas las partes. Document Released: 03/11/2008 Document Re-Released: 09/11/2009 [facility] Patient Information 2011 [place], [place].

## 2011-06-20 NOTE — Progress Notes (Signed)
Needs refill labetalol 100mg  . Pelvic pressure. No vaginal discharge. Pulse 87

## 2011-06-21 ENCOUNTER — Ambulatory Visit (HOSPITAL_COMMUNITY): Payer: Self-pay

## 2011-06-21 LAB — POCT URINALYSIS DIP (DEVICE)
Bilirubin Urine: NEGATIVE
Glucose, UA: NEGATIVE mg/dL
Nitrite: NEGATIVE

## 2011-06-24 ENCOUNTER — Ambulatory Visit (INDEPENDENT_AMBULATORY_CARE_PROVIDER_SITE_OTHER): Payer: Self-pay | Admitting: *Deleted

## 2011-06-24 VITALS — BP 125/76

## 2011-06-24 DIAGNOSIS — O10019 Pre-existing essential hypertension complicating pregnancy, unspecified trimester: Secondary | ICD-10-CM

## 2011-06-24 NOTE — Progress Notes (Signed)
P = 94   NST only today

## 2011-06-27 ENCOUNTER — Ambulatory Visit (INDEPENDENT_AMBULATORY_CARE_PROVIDER_SITE_OTHER): Payer: Self-pay | Admitting: Obstetrics & Gynecology

## 2011-06-27 ENCOUNTER — Encounter: Payer: Self-pay | Admitting: Obstetrics & Gynecology

## 2011-06-27 VITALS — BP 129/77 | Temp 98.7°F | Wt 255.8 lb

## 2011-06-27 DIAGNOSIS — O336XX0 Maternal care for disproportion due to hydrocephalic fetus, not applicable or unspecified: Secondary | ICD-10-CM

## 2011-06-27 DIAGNOSIS — IMO0002 Reserved for concepts with insufficient information to code with codable children: Secondary | ICD-10-CM

## 2011-06-27 DIAGNOSIS — O169 Unspecified maternal hypertension, unspecified trimester: Secondary | ICD-10-CM

## 2011-06-27 DIAGNOSIS — O2690 Pregnancy related conditions, unspecified, unspecified trimester: Secondary | ICD-10-CM

## 2011-06-27 LAB — POCT URINALYSIS DIP (DEVICE)
Bilirubin Urine: NEGATIVE
Glucose, UA: NEGATIVE mg/dL
Ketones, ur: NEGATIVE mg/dL
Protein, ur: NEGATIVE mg/dL

## 2011-06-27 NOTE — Progress Notes (Signed)
BP well controlled on labetalol.  Need 3 hr gtt result. Can't find on chart.  Hx of LGA and failed one hour.  NST today.  Large fundal height, but pt morbidly obese.  Pt getting growth [place].  CBG=82 approx 4 hrs pp.

## 2011-06-27 NOTE — Progress Notes (Signed)
P88, Used Interpreter [employer], c/o swelling in feet if up a lot, c/o a little pelvic pain,

## 2011-06-27 NOTE — Progress Notes (Signed)
Addended by: Lesly Dukes on: 06/27/2011 11:21 AM   Modules accepted: Orders

## 2011-07-01 ENCOUNTER — Ambulatory Visit (INDEPENDENT_AMBULATORY_CARE_PROVIDER_SITE_OTHER): Payer: Self-pay | Admitting: *Deleted

## 2011-07-01 ENCOUNTER — Other Ambulatory Visit: Payer: Self-pay

## 2011-07-01 VITALS — BP 125/75

## 2011-07-01 DIAGNOSIS — O169 Unspecified maternal hypertension, unspecified trimester: Secondary | ICD-10-CM

## 2011-07-01 DIAGNOSIS — IMO0002 Reserved for concepts with insufficient information to code with codable children: Secondary | ICD-10-CM

## 2011-07-01 NOTE — Progress Notes (Signed)
NST performed on 07/01/2011 was reviewed and was found to be reactive.  Continue recommended antenatal testing and prenatal care.

## 2011-07-01 NOTE — Progress Notes (Signed)
P-101 

## 2011-07-04 ENCOUNTER — Ambulatory Visit (INDEPENDENT_AMBULATORY_CARE_PROVIDER_SITE_OTHER): Payer: Self-pay | Admitting: *Deleted

## 2011-07-04 VITALS — BP 118/67

## 2011-07-04 DIAGNOSIS — O169 Unspecified maternal hypertension, unspecified trimester: Secondary | ICD-10-CM

## 2011-07-04 DIAGNOSIS — IMO0002 Reserved for concepts with insufficient information to code with codable children: Secondary | ICD-10-CM

## 2011-07-04 NOTE — Progress Notes (Signed)
P-101 

## 2011-07-04 NOTE — Progress Notes (Signed)
I have reviewed the category I tracing of 9/10

## 2011-07-08 ENCOUNTER — Ambulatory Visit (INDEPENDENT_AMBULATORY_CARE_PROVIDER_SITE_OTHER): Payer: Self-pay | Admitting: Family Medicine

## 2011-07-08 VITALS — BP 123/83 | HR 93 | Temp 97.5°F | Wt 259.6 lb

## 2011-07-08 DIAGNOSIS — O169 Unspecified maternal hypertension, unspecified trimester: Secondary | ICD-10-CM

## 2011-07-08 DIAGNOSIS — IMO0002 Reserved for concepts with insufficient information to code with codable children: Secondary | ICD-10-CM

## 2011-07-08 DIAGNOSIS — Z23 Encounter for immunization: Secondary | ICD-10-CM

## 2011-07-08 LAB — POCT URINALYSIS DIP (DEVICE)
Bilirubin Urine: NEGATIVE
Glucose, UA: NEGATIVE mg/dL
Hgb urine dipstick: NEGATIVE
Specific Gravity, Urine: <=1.005 (ref 1.005–1.030)

## 2011-07-08 MED ORDER — INFLUENZA VIRUS VACC SPLIT PF IM SUSP: 0.5000 mL | Freq: Once | INTRAMUSCULAR | Status: DC

## 2011-07-08 NOTE — Progress Notes (Signed)
Subjective:    Dailynn Nancarrow is a 35 y.o. female being seen today for her obstetrical visit. She is at [redacted]w[redacted]d gestation. Patient reports no complaints. Fetal movement: normal.  Menstrual History: OB History    Grav Para Term Preterm Abortions TAB SAB Ect Mult Living   3 2 2  0      2       Patient's last menstrual period was 10/30/2010.    Objective:    BP 123/83  Pulse 93  Temp 97.5 F (36.4 C)  Wt 259 lb 9.6 oz (117.754 kg)  LMP 10/30/2010 FHT:  145-155 BPM  Uterine Size: size equals dates at approximately 36 cm  Presentation: unsure    Cervix 1 cm/long  NST Reviewed: [place] 145-155, +accels, no decels, no contractions Category 1 Tracing Assessment:    Pregnancy 35 and 6/7 weeks  Chronic Hypertension Plan:  GBS today, GC/Ch off urine. Infant feeding: plans to breastfeed, plans to bottle feed. Continue 2x/weekly NST Follow up in 1 Week.

## 2011-07-08 NOTE — Progress Notes (Signed)
Pain: none Pressure: pelvic

## 2011-07-08 NOTE — Progress Notes (Signed)
Patient scheduled for induction of labor on October 23,2012 at 7am.

## 2011-07-08 NOTE — Patient Instructions (Signed)
SOLICITE ATENCIN MDICA DE INMEDIATO SI:  Las contracciones se intensifican, se hacen ms regulares y [employer] s.   Tiene una prdida importante de lquido de la vagina   La temperatura oral se eleva sin motivo por encima de 100.4 F o segn le indique el profesional que la asiste.   Elimina una mucosidad sanguinolenta.   Presenta hemorragia vaginal.   Presenta dolor abdominal constante.   Siente un dolor en la parte baja de la espalda que nunca haba sentido antes.   Siente que el beb empuja hacia abajo y le causa presin plvica.   El beb no se mueve tanto como antes.  Document Released: 07/03/2005 Document Re-Released: 03/13/2010 [facility] Patient Information 2011 [place], [place].

## 2011-07-09 LAB — GC/CHLAMYDIA PROBE AMP, URINE: Chlamydia, Swab/Urine, PCR: NEGATIVE

## 2011-07-11 ENCOUNTER — Ambulatory Visit (INDEPENDENT_AMBULATORY_CARE_PROVIDER_SITE_OTHER): Payer: Self-pay | Admitting: *Deleted

## 2011-07-11 VITALS — BP 131/74

## 2011-07-11 DIAGNOSIS — IMO0002 Reserved for concepts with insufficient information to code with codable children: Secondary | ICD-10-CM

## 2011-07-11 DIAGNOSIS — O169 Unspecified maternal hypertension, unspecified trimester: Secondary | ICD-10-CM

## 2011-07-11 LAB — STREP B DNA PROBE: GBS: POSITIVE

## 2011-07-11 NOTE — Progress Notes (Signed)
P = 101     NST only today   Pt reports URI sx and back pain today. Pt informed she can take tylenol po and use saline nasal spray for nasal cogjestion.

## 2011-07-12 LAB — CULTURE, BETA STREP (GROUP B ONLY)

## 2011-07-15 ENCOUNTER — Ambulatory Visit (INDEPENDENT_AMBULATORY_CARE_PROVIDER_SITE_OTHER): Payer: Self-pay | Admitting: Family Medicine

## 2011-07-15 ENCOUNTER — Other Ambulatory Visit: Payer: Self-pay | Admitting: Obstetrics & Gynecology

## 2011-07-15 VITALS — BP 131/89 | Wt 262.7 lb

## 2011-07-15 DIAGNOSIS — Z22330 Carrier of Group B streptococcus: Secondary | ICD-10-CM | POA: Insufficient documentation

## 2011-07-15 DIAGNOSIS — IMO0002 Reserved for concepts with insufficient information to code with codable children: Secondary | ICD-10-CM

## 2011-07-15 DIAGNOSIS — O169 Unspecified maternal hypertension, unspecified trimester: Secondary | ICD-10-CM

## 2011-07-15 LAB — POCT URINALYSIS DIP (DEVICE)
Glucose, UA: NEGATIVE mg/dL
Hgb urine dipstick: NEGATIVE
Specific Gravity, Urine: 1.01 (ref 1.005–1.030)
Urobilinogen, UA: 0.2 mg/dL (ref 0.0–1.0)
pH: 7 (ref 5.0–8.0)

## 2011-07-15 NOTE — Patient Instructions (Addendum)
Parto vaginal luego de una cesrea (Vaginal Birth After Cesarean Section, VBAC) Un parto vaginal luego de un parto por cesrea es dar a luz por la vagina luego de haber dado a luz por medio de una intervencin [place]. En el pasado, si una mujer tena un beb por cesrea, todos los partos posteriores deban hacerse por cesrea. Esto ya no es as. Puede ser seguro para la mam intentar un parto vaginal luego de una cesrea. La decisin final de tener un parto vaginal o por cesrea debe tomarse en conjunto, entre la paciente y el mdico. [place] riesgos y los beneficios debern evaluarse con relacin a los motivos y al tipo de cesrea previa LAS MUJERES QUE QUIEREN TENER UN PARTO VAGINAL, DEBEN CONSULTAR CON SU MDICO PARA ASEGURARSE QUE:  La cesrea anterior se realiz con una incisin uterina transversal (no con una incisin vertical clsica).   El canal de parto es lo suficientemente grande como para que pase el [place].   No ha sido sometida a otras operaciones del tero.   Durante el trabajo de parto le realizarn un monitoreo electrnico fetal, en todo momento.   Es necesario que haya un quirfano disponible y listo en caso de necesitar una cesrea de emergencia.   Un cirujano y personal de quirfano estarn disponibles en todo momento durante el [place] de parto, para realizar una cesrea en caso de ser necesario.   Habr un anestesista disponible en caso de necesitar una cesrea de emergencia.   La nursery est lista cuenta con personal especializado y el equipo disponible para cuidar al beb en caso de emergencia.  BENEFICIOS:  Permanencia ms breve en el hospital.   Menores costos en el parto, la nurse y el hospital.   Menos prdida de sangre y menos probabilidad de necesitar una transfusin sangunea.   Menos probabilidad de tener fiebre o molestias como consecuencia de una ciruga mayor   Menos riesgo de cogulos sanguneos.   Menos riesgo de sufrir infecciones.   Recuperacin  ms rpida luego del alta mdica.   Menos riesgo de complicaciones quirrgicas, como apertura o hernia de la incisin.   Disminucin del riesgo de lesiones a otros rganos   Menor riesgo de remocin del tero (histerectoma)   Menor riesgo de que la placenta cubra parcial o completamente la abertura del tero (placenta previa) en embarazos futuros   Posibilidad de tener una familia grande, si lo desea.  RIESGOS  Ruptura del tero.   Si el tero se rompe Production manager.   Todas las complicaciones de [place] mayor y lesiones en otros rganos.   Hemorragia excesiva, cogulos e infeccin.   Lower Apgar Puntuacin Apgar baja (mtodo que evala al recin nacido segn su apariencia, pulso, muecas, actividad y respiracion) y ms riesgos para el beb.   Hay mas riesgo de ruptura del tero si se induce o aumenta el trabajo de [place].   Hay un mayor riesgo de ruptura uterina si se usan medicamentos para madurar el cuello.  NO DEBE LLEVARSE A CABO SI:  La cesrea previa se realiz con una incicin vertical (clsica) o con forma de T, o usted no sabe cul de [employer] han practicado.   Ha sufrido ruptura del tero.   Le han practicado una ciruga de tero.   Tiene problemas mdicos u obsttricos.   El beb est en problemas.   Tuvo dos cesreas previas y ningn parto vaginal.  OTRAS COSAS QUE DEBE SABER:  La anestesia peridural es segura.  Es seguro dar vuelta al beb si se encuentra de nalgas (intentar una versin ceflica externa).   Es seguro intentarlo en caso de mellizos.   Los embarazos de ms de 40 semanas no tienen xito con este procedimiento.   Hay un aumento de fracasos en embarazadas obesas.   Hay un aumento del porcentaje de fracasos si el beb pesa 4 Kg o ms.   Hay aumento en el porcentaje de fracasos si el intervalo entre la operacin cesrea y el parto vaginal es de menos de 19 meses.   Hay un aumento en el porcentaje de fracasos si ha  sufrido preeclampsia hipertensin arterial, protenas en la orina e hinchazn del rostro y las extremidades.   El parto vaginal ser muy exitoso si tuvo un parto vaginal previo.   Tambin es [employer] caso que el [place] de parto comience espontneamente antes de la fecha.   El parto vaginal luego de Neomia Dear cesrea es similar a un parto espontneo vaginal normal.  Es importante que converse con su mdico desde comienzos del Psychiatrist de modo que pueda [employer], beneficios y opciones. De este modo tendr tiempo de decidir que es lo mejor en su caso particular en relacin a su parto por cesrea anterior. Hay que tener en cuenta que puede haber cambios en la madre durante el [place], lo que hace necesario cambiar su decisin o la del mdico. Los consejos, preocupaciones y decisiones debern documentarse en la historia clnica y debe ser firmada por todas las partes. Document Released: 03/11/2008 Document Re-Released: 09/11/2009 [facility] Patient Information 2011 [place], [place]. Hipertensin en el embarazo Presin Arterial Elevada Durante el Embarazo (Hypertension In Pregnancy) Puede presentar presin alta (hipertensin) en la ltima mitad del embarazo (20 semanas o ms). Esto ocurre an si su presin arterial hubiera sido normal antes del embarazo. La presin alta en el embarazo puede ser seal de un trastorno llamado preeclampsia. Esto se percibe especialmente si presenta protenas en la orina. La preeclampsia puede no dar sntomas y hallarse slo en los exmenes de laboratorio y en el examen fsico. Si progresa, puede volverse eclampsia (convulsiones) o [place] de HELLP (anormalidades del hgado, problemas de coagulacin, mayor aumento de la presin, aumento de protenas en la orina). Ambas son enfermedades graves y [place]issie Reese para la vida de la madre y del beb. Uana mujer que tiene presin alta antes de quedar embarazada tambin puede tener preclampsia sobreimpuesta. SOLICITE ATENCIN  MDICA SI:  Dolor de estmago.   Hinchazn repentina y [employer], tobillos, o cara.   Aumenta de peso de 4 libras (1,8 kg) en una semana.   Vomita repetidas veces.   Sufre una hemorragia vaginal.   No percibe los movimientos del beb.   Dolor de [place].   Problemas en la visin (visin borrosa o doble).   Movimientos o espasmos musculares.   Falta de aire.   Labios o uas azulados.   Protenas en la orina.  Document Released: 09/23/2005 Document Re-Released: 03/13/2010 [facility] Patient Information 2011 [place], [place].

## 2011-07-15 NOTE — Progress Notes (Signed)
Has u/s for growth on 10/11 Desires TOLAC Subjective:    Jeanette [name] is a 35 y.o. [id number] [redacted]w[redacted]d being seen today for her obstetrical visit.  Patient reports no complaints. Fetal movement: normal.  Objective:    BP 131/89  Wt 262 lb 11.2 oz (119.16 kg)  LMP 10/30/2010  Physical Exam  Exam  FHT:  145 BPM  Uterine Size: 38 cm  Presentation: cephalic   NST reviewed and reactive.   Assessment:    Pregnancy:  [id number]    Plan:    Patient Active Problem List  Diagnoses  . Pica  . Hypertension complicating pregnancy  . Migraine headache  . Previous cesarean delivery affecting pregnancy, antepartum  . Hydrocephalic fetus causing disproportion, delivered  . GBS carrier    Desires TOLAC Follow up in twice weekly.

## 2011-07-15 NOTE — Progress Notes (Signed)
Edema- feet/hands. Pressure-lower abd, vagina.  Pulse- 93. No vaginal discharge.

## 2011-07-16 ENCOUNTER — Telehealth (HOSPITAL_COMMUNITY): Payer: Self-pay | Admitting: *Deleted

## 2011-07-16 ENCOUNTER — Encounter (HOSPITAL_COMMUNITY): Payer: Self-pay | Admitting: *Deleted

## 2011-07-16 NOTE — Telephone Encounter (Signed)
Preadmission screen  

## 2011-07-18 ENCOUNTER — Ambulatory Visit (INDEPENDENT_AMBULATORY_CARE_PROVIDER_SITE_OTHER): Payer: Self-pay | Admitting: *Deleted

## 2011-07-18 ENCOUNTER — Ambulatory Visit (HOSPITAL_COMMUNITY)
Admission: RE | Admit: 2011-07-18 | Discharge: 2011-07-18 | Disposition: A | Discharge status: Home / Self Care | Payer: Self-pay | Source: Ambulatory Visit | Attending: Obstetrics and Gynecology | Admitting: Obstetrics and Gynecology

## 2011-07-18 ENCOUNTER — Other Ambulatory Visit (HOSPITAL_COMMUNITY): Payer: Self-pay | Admitting: Obstetrics and Gynecology

## 2011-07-18 VITALS — BP 138/72

## 2011-07-18 DIAGNOSIS — O169 Unspecified maternal hypertension, unspecified trimester: Secondary | ICD-10-CM

## 2011-07-18 DIAGNOSIS — Z8751 Personal history of pre-term labor: Secondary | ICD-10-CM | POA: Insufficient documentation

## 2011-07-18 DIAGNOSIS — E669 Obesity, unspecified: Secondary | ICD-10-CM | POA: Insufficient documentation

## 2011-07-18 DIAGNOSIS — O2690 Pregnancy related conditions, unspecified, unspecified trimester: Secondary | ICD-10-CM

## 2011-07-18 DIAGNOSIS — O139 Gestational [pregnancy-induced] hypertension without significant proteinuria, unspecified trimester: Secondary | ICD-10-CM | POA: Insufficient documentation

## 2011-07-18 DIAGNOSIS — O99210 Obesity complicating pregnancy, unspecified trimester: Secondary | ICD-10-CM | POA: Insufficient documentation

## 2011-07-18 DIAGNOSIS — O09299 Supervision of pregnancy with other poor reproductive or obstetric history, unspecified trimester: Secondary | ICD-10-CM | POA: Insufficient documentation

## 2011-07-18 NOTE — Progress Notes (Signed)
P=87 , here for NST, used interpreter Delorise Royals,

## 2011-07-22 ENCOUNTER — Other Ambulatory Visit: Payer: Self-pay | Admitting: Obstetrics and Gynecology

## 2011-07-22 ENCOUNTER — Ambulatory Visit (INDEPENDENT_AMBULATORY_CARE_PROVIDER_SITE_OTHER): Payer: Self-pay | Admitting: Physician Assistant

## 2011-07-22 VITALS — BP 128/83 | Temp 97.7°F | Wt 264.6 lb

## 2011-07-22 DIAGNOSIS — O169 Unspecified maternal hypertension, unspecified trimester: Secondary | ICD-10-CM

## 2011-07-22 NOTE — Patient Instructions (Signed)
Desgarro Intraoral (Intaoral Laceration) El examen muestra que tiene un corte dentro de la boca. Si es profundo o [address], se necesitarn puntos para cerrarlo. La mayor parte de los cortes en la boca son menores y se curan dentro de los 4 o [address]. Si se utilizaron puntos, normalmente se saldrn solos dentro de la primera semana. Para controlar el sangrado deber aplicar presin directa al corte mediante la compresin con los dedos pulgar e ndice con un trozo de gasa durante varios minutos. Chupar un cubo de hielo o un helado de palito reducir Chief Technology Officer y la inflamacin. Durante los [place] o tres das siga una dieta [place], evite los jugos de frutas ctricas y [place] de las comidas muy picantes o saladas. Enjuague su boca con agua tibia despus de comer para retirar las partculas de comida de la herida. Observe si se presentan signos de infeccin en la herida. Estos podrn ser dolor inusual, e hinchazn o segregacin de pus alrededor de la herida. Consulte con el mdico o vuelva de inmediato si cree que la herida est infectada.  DEBERA APLICARSE LA VACUNA CONTRA EL TETANOS SI:  No sabe cuando recibi la ltima dosis.   Nunca recibi esta vacuna.   La herida est sucia.  Si usted necesita aplicarse la vacuna y se niega a recibirla, corre riesgo de contraer ttanos. sta es una enfermedad grave.  Document Released: 09/23/2005 Document Re-Released: 07/21/2009 [facility] Patient Information 2011 [place], [place].

## 2011-07-22 NOTE — Progress Notes (Signed)
Reactive NST. IOL scheduled for 07/30/11. Cervix: 2/50/vtx/-2 Pt reports EFW last week >8#, report unavailable TOLAC papers signed

## 2011-07-22 NOTE — Progress Notes (Signed)
Edema in hands and feet at times. Has some pain in her lower back.

## 2011-07-25 ENCOUNTER — Ambulatory Visit (INDEPENDENT_AMBULATORY_CARE_PROVIDER_SITE_OTHER): Payer: Self-pay | Admitting: *Deleted

## 2011-07-25 VITALS — BP 125/78

## 2011-07-25 DIAGNOSIS — IMO0002 Reserved for concepts with insufficient information to code with codable children: Secondary | ICD-10-CM

## 2011-07-25 DIAGNOSIS — O169 Unspecified maternal hypertension, unspecified trimester: Secondary | ICD-10-CM

## 2011-07-25 NOTE — Progress Notes (Signed)
NST reviewed and reactive.  

## 2011-07-25 NOTE — Progress Notes (Signed)
P = 91   NST/AFI only today

## 2011-07-29 ENCOUNTER — Other Ambulatory Visit: Payer: Self-pay | Admitting: Obstetrics & Gynecology

## 2011-07-29 ENCOUNTER — Ambulatory Visit (INDEPENDENT_AMBULATORY_CARE_PROVIDER_SITE_OTHER): Payer: Self-pay | Admitting: Obstetrics and Gynecology

## 2011-07-29 VITALS — BP 126/79 | Temp 97.2°F | Wt 263.8 lb

## 2011-07-29 DIAGNOSIS — O34219 Maternal care for unspecified type scar from previous cesarean delivery: Secondary | ICD-10-CM

## 2011-07-29 DIAGNOSIS — IMO0002 Reserved for concepts with insufficient information to code with codable children: Secondary | ICD-10-CM

## 2011-07-29 LAB — POCT URINALYSIS DIP (DEVICE)
Bilirubin Urine: NEGATIVE
Ketones, ur: NEGATIVE mg/dL
Nitrite: NEGATIVE
Protein, ur: 30 mg/dL — AB
pH: 6 (ref 5.0–8.0)

## 2011-07-29 NOTE — Progress Notes (Signed)
P = 83     Pt wishes to discuss birth control options- plans condoms only, however she got pregnant using condoms.  Pt is scheduled for TOLAC tomorrow @ 0700.

## 2011-07-29 NOTE — Progress Notes (Signed)
Rare UC on EFM. NST reactive. On labetalol as dir. Plan is for Depo at 6 wks, breastfeed. No urinary sx but sm heme and lg LE so will send C&S and advised increase fluids. Hx LGA 10#, EFW today 9#. Still desires TOLAC and is scheduled for IOL tomorrow am.

## 2011-07-29 NOTE — Progress Notes (Signed)
Pt having some pelvic pressure, unsure of contractions.

## 2011-07-29 NOTE — Patient Instructions (Signed)
Report to [facility] 10/23 for IOL as scheduledEmbarazo - Systems analyst trimestre (Pregnancy - Third Trimester) El tercer trimestre del embarazo (los ltimos 3 meses) es el perodo de cambios ms rpidos que atraviesan usted y el beb. El aumento de peso es ms rpido. El beb alcanza un largo de aproximadamente 50 cm (20 pulgadas) y pesa entre 2,700 y 4,500 kg (6 a 10 libras). El beb gana ms tejido graso y ya est listo para la vida fuera del cuerpo de la [place]. Mientras estn en el interior, los bebs tienen perodos de sueo y vigilia, Warehouse manager y tienen hipo. Quizs sienta pequeas contracciones del tero. Este es el falso trabajo de [place]. Tambin se las conoce como contracciones de [place]. Es como una prctica del parto. Los problemas ms habituales de esta etapa del embarazo incluyen mayor dificultad para respirar, hinchazn de las manos y los pies por retencin de lquidos y la necesidad de Geographical information systems officer con ms frecuencia debido a que el tero y el beb presionan sobre la vejiga.  EXAMENES PRENATALES  Durante los [employer], deber seguir realizando pruebas de [place], segn avance el [place]. Estas pruebas se realizan para controlar su salud y la del beb. Tambin se realizan anlisis de sangre para [employer] niveles de [place]. La anemia (bajo nivel de hemoglobina) es frecuente durante el embarazo. Para prevenirla, se administran hierro y vitaminas. Tambin le harn nuevas pruebas para descartar la diabetes. Podrn repetirle algunas de las [employer] hicieron previamente.   En cada visita le medirn el tamao del tero. Es para asegurarse de que el beb se desarrolla correctamente.   Tambin en cada visita la pesarn. Esto se realiza para asegurarse de que aumenta de peso al ritmo indicado y que usted y su beb evolucionan normalmente.   En algunas ocasiones se realiza una ecografa para confirmar el correcto desarrollo y evolucin del beb. Esta prueba se realiza con  ondas sonoras inofensivas para el beb, de modo que el profesional pueda calcular con ms precisin la fecha del [place].   Discuta las posibilidades de la anestesia si necesita cesrea.  Algunas veces se realizan pruebas especializadas del lquido amnitico que rodea al beb. Esta prueba se denomina amniocentesis. El lquido amnitico se obtiene introduciendo una aguja en el abdomen (vientre). En ocasiones se lleva a cabo cerca del final del embarazo, si es Optician, dispensing. En este caso se realiza para asegurarse de que los pulmones del beb estn lo suficientemente maduros como para que pueda vivir fuera del tero. CAMBIOS QUE OCURREN EN EL TERCER TRIMESTRE DEL EMBARAZO Su organismo atravesar diferentes cambios durante el embarazo que varan de Neomia Dear persona a Educational psychologist. Converse con el profesional que la asiste acerca los cambios que usted note y que la preocupen.  Durante el ltimo trimestre probablemente sienta un aumento del apetito. Es normal tener "antojos" de Development worker, community. Esto vara de Neomia Dear persona a otra y de un embarazo a Therapist, art.   Podrn aparecer las primeras estras en las caderas, abdomen y [place]. Estos son cambios normales del cuerpo durante el [place]. No existen medicamentos ni ejercicios que puedan prevenir [employer].   El estreimiento puede tratarse con un laxante o agregando fibra a su dieta. Beber grandes cantidades de lquidos, tomar fibras en forma de verduras, frutas y granos integrales es de [place].   Tambin es beneficioso practicar actividad fsica. Si ha sido una persona Engineer, mining, podr continuar con la [employer] de las actividades durante el  mismo. Si ha sido menos [place], puede ser beneficioso que comience con un programa de ejercicios, Museum/gallery exhibitions officer. Consulte con el profesional que la asiste antes de comenzar un programa de ejercicios.   Evite el consumo de cigarrillos, el alcohol, los medicamentos no prescritos y las "drogas de la  calle" durante el [place]. Estas sustancias qumicas afectan la formacin y el desarrollo del beb. Evite estas sustancias durante todo el embarazo para asegurar el nacimiento de un beb sano.   Dolor de espalda, venas varicosas y hemorroides podran aparecer o empeorar.   Los movimientos del beb pueden ser ms bruscos y aparecer ms a menudo.   Puede que note dificultades para respirar facilmente.   El ombligo podra salrsele hacia afuera.   Puede segregar un lquido amarillento (calostro) de las [place].   Puede segregar mucus con sangre. Esto normalmente ocurre unos [address] a una semana antes de que comience el [place].  INSTRUCCIONES PARA EL CUIDADO DOMICILIARIO  La mayor parte de los cuidados que se aconsejan son los mismos que los indicados para las primeras etapas del Psychiatrist. Es importante que concurra a todas las citas con el profesional y siga sus instrucciones con Camera operator a los medicamentos que deba Chemical engineer, a la actividad fsica y a Psychologist, forensic.   Durante el embarazo debe obtener nutrientes para usted y para su beb. Consuma alimentos balanceados a intervalos regulares. Elija alimentos como carne, pescado, [place] y otros productos lcteos descremados, verduras, frutas, panes integrales y cereales. El Equities trader cul es el aumento de peso ideal.   Las relaciones sexuales pueden continuarse hasta casi el final del embarazo, si no se presentan otros problemas como prdida prematura (antes de tiempo) de lquido amnitico, hemorragia vaginal o dolor abdominal (en el vientre).   Realice [employer], si no tiene restricciones. Consulte con el profesional que la asiste si no sabe con certeza si determinados ejercicios son seguros. El mayor aumento de peso se produce [employer] ltimos trimestres del [place].   Haga reposo con frecuencia, con las piernas elevadas, o segn lo necesite para evitar los calambres y el dolor de cintura.   Use un  buen sostn o como los que se usan para hacer deportes para Paramedic la sensibilidad de las [place]. Tambin puede serle til si lo [place] mientras duerme. Si pierde Product manager, podr [employer].   No utilice la baera con agua caliente, baos turcos y saunas.   Colquese el cinturn de seguridad cuando conduzca. Este la proteger a usted y al beb en caso de accidente.   Evite comer carne cruda y el contacto con los utensilios y desperdicios de los gatos. Estos elementos contienen grmenes que pueden causar defectos de nacimiento en el beb.   Es fcil perder algo de orina durante el [place]. Apretar y Chief Operating Officer los msculos de la pelvis la ayudar con este problema. Practique detener la miccin cuando est en el bao. Estos son los mismos msculos que Development worker, international aid. Son [employer] mismos msculos que utiliza cuando trata de [employer]der System gases. Puede practicar apretando estos msculos [employer], y repetir esto tres veces por da aproximadamente. Una vez que conozca qu msculos debe contraer, no realice estos ejercicios durante la miccin. Puede favorecerle una infeccin si la orina vuelve hacia atrs.   Pida ayuda si tiene necesidades econmicas, de asesoramiento o nutricionales durante el [place]. El profesional podr ayudarla con respecto a estas necesidades, o derivarla a otros especialistas.  Practique la ida [employer] hospital a modo de [place].   Tome clases prenatales junto con su pareja para comprender, practicar y hacer preguntas acerca del Aleen Campi de parto y el nacimiento.   Prepare la habitacin del beb.   No viaje fuera de la ciudad a menos que sea absolutamente necesario y con el consejo del mdico.   Use slo zapatos bajos sin taco para tener un mejor equilibrio y prevenir cadas.  EL CONSUMO DE MEDICAMENTOS Y DROGAS DURANTE EL EMBARAZO  Contine tomando las vitaminas apropiadas para esta etapa tal como se le indic. Las vitaminas deben contener un  miligramo de cido flico y deben suplementarse con hierro. Guarde todas las vitaminas fuera del alcance de los nios. La ingestin de slo un par de vitaminas o comprimidos que contengan hierro pueden ocasionar la [employer] en un beb o en un nio pequeo.   Evite el uso de [place], inclusive los de venta [place], que no hayan sido prescritos o indicados por el profesional que la asiste. Algunos medicamentos pueden causar problemas fsicos al beb. Utilice los medicamentos de venta libre o de prescripcin para Chief Technology Officer, Environmental health practitioner o la [place], segn se lo indique el profesional que lo asiste. No utilice aspirina, ibuprofeno (Motrin, Advil, Nuprin) o naproxeno (Aleve) a menos que el profesional la autorice.   El alcohol se asocia a cierto nmero de defectos del nacimiento, incluido el sndrome de alcoholismo fetal. Debe evitar el consumo de alcohol en cualquiera de sus formas. El cigarrillo causa nacimientos prematuros y bebs de bajo peso al nacer. Las drogas de la calle son muy nocivas para el beb y estn absolutamente prohibidas. Un beb que nace de [employer], ser adicto al nacer. Ese beb tendr los mismos sntomas de abstinencia que un adulto.   Infrmele al profesional si consume alguna droga.  SOLICITE ATENCIN MDICA SI: Tiene alguna preocupacin Academic librarian. Es mejor que llame para formular las preguntas si no puede esperar hasta la prxima visita, que sentirse preocupada por ellas.  DECISIONES ACERCA DE LA CIRCUNCISIN Usted puede saber o no cul es el sexo de su beb. Si es un varn, ste es el momento de pensar acerca de la circuncisin. La circuncisin es la extirpacin del prepucio. Esta es la piel que cubre el extremo sensible del pene. No hay un motivo mdico que lo justifique. Generalmente la decisin se toma segn lo que sea popular en ese momento, o se basa en creencias religiosas. Podr conversar estos temas con el profesional que la asiste. SOLICITE ATENCIN  MDICA DE INMEDIATO SI:  La temperatura oral se eleva sin motivo por encima de 102 F (38.9 C) o segn le indique el profesional que la asiste.   Tiene una prdida de lquido por la vagina (canal de parto). Si sospecha una ruptura de las [place], tmese la temperatura y llame al profesional para informarlo sobre esto.   Observa unas pequeas manchas, una hemorragia vaginal o elimina cogulos. Avsele al profesional acerca de la cantidad y de cuntos apsitos est utilizando.   Presenta un olor desagradable en la secrecin vaginal y observa un cambio en el color, de transparente a blanco.   Ha vomitado durante ms de 24 horas.   Presenta escalofros o fiebre.   Comienza a sentir falta de aire.   Siente ardor al Beatrix Shipper.   [place] o sube ms de 900 g (ms de 2 libras), o segn lo indicado por el profesional que la asiste. Observa que sbitamente se le hinchan  el rostro, las [place], los pies o las piernas.   Presenta dolor abdominal. Las molestias en el ligamento redondo son Neomia Dear causa benigna (no cancerosa) frecuente de Engineer, mining abdominal durante el Psychiatrist, pero el profesional que la asiste deber evaluarlo.   Presenta dolor de cabeza intenso que no se [place].   Si no siente los movimientos del beb durante ms de tres horas. Si piensa que el beb no se mueve tanto como lo haca habitualmente, coma algo que Psychologist, clinical y [employer] Corporation lado izquierdo durante [place]. El beb debe moverse al menos 4  5 veces por hora. Comunquese inmediatamente si el beb se mueve menos que lo indicado.   Se cae, se ve involucrada en un accidente automovilstico o sufre algn tipo de traumatismo.   En su hogar hay violencia mental o fsica.  Document Released: 07/03/2005 Document Revised: 06/05/2011 [facility] Patient Information 2012 [place], [place].

## 2011-07-29 NOTE — Progress Notes (Signed)
10/11 NST reactive

## 2011-07-30 ENCOUNTER — Encounter (HOSPITAL_COMMUNITY): Payer: Self-pay | Admitting: Anesthesiology

## 2011-07-30 ENCOUNTER — Inpatient Hospital Stay (HOSPITAL_COMMUNITY)
Admission: RE | Admit: 2011-07-30 | Discharge: 2011-08-01 | DRG: 775 | Disposition: A | Discharge status: Home / Self Care | Payer: Medicaid Other | Source: Ambulatory Visit | Attending: Obstetrics & Gynecology | Admitting: Obstetrics & Gynecology

## 2011-07-30 ENCOUNTER — Encounter (HOSPITAL_COMMUNITY): Payer: Self-pay

## 2011-07-30 ENCOUNTER — Inpatient Hospital Stay (HOSPITAL_COMMUNITY): Payer: Medicaid Other | Admitting: Anesthesiology

## 2011-07-30 DIAGNOSIS — O139 Gestational [pregnancy-induced] hypertension without significant proteinuria, unspecified trimester: Principal | ICD-10-CM | POA: Diagnosis present

## 2011-07-30 DIAGNOSIS — O34219 Maternal care for unspecified type scar from previous cesarean delivery: Secondary | ICD-10-CM

## 2011-07-30 DIAGNOSIS — O99892 Other specified diseases and conditions complicating childbirth: Secondary | ICD-10-CM | POA: Diagnosis present

## 2011-07-30 DIAGNOSIS — Z22330 Carrier of Group B streptococcus: Secondary | ICD-10-CM

## 2011-07-30 LAB — CBC
MCHC: 32 g/dL (ref 30.0–36.0)
RDW: 16.8 % — ABNORMAL HIGH (ref 11.5–15.5)

## 2011-07-30 LAB — RPR: RPR Ser Ql: NONREACTIVE

## 2011-07-30 MED ORDER — ACETAMINOPHEN 325 MG PO TABS: 650.0000 mg | ORAL_TABLET | ORAL | Status: DC | PRN

## 2011-07-30 MED ORDER — DIPHENHYDRAMINE HCL 25 MG PO CAPS: 25.0000 mg | ORAL_CAPSULE | Freq: Four times a day (QID) | ORAL | Status: DC | PRN

## 2011-07-30 MED ORDER — LABETALOL HCL 100 MG PO TABS
100.0000 mg | ORAL_TABLET | Freq: Three times a day (TID) | ORAL | Status: DC
  Administered 2011-07-30 (×2): 100 mg via ORAL
  Filled 2011-07-30 (×4): qty 1

## 2011-07-30 MED ORDER — FLEET ENEMA 7-19 GM/118ML RE ENEM: 1.0000 | ENEMA | RECTAL | Status: DC | PRN

## 2011-07-30 MED ORDER — ONDANSETRON HCL 4 MG/2ML IJ SOLN: 4.0000 mg | Freq: Four times a day (QID) | INTRAMUSCULAR | Status: DC | PRN

## 2011-07-30 MED ORDER — DIPHENHYDRAMINE HCL 50 MG/ML IJ SOLN: 12.5000 mg | INTRAMUSCULAR | Status: DC | PRN

## 2011-07-30 MED ORDER — FENTANYL 2.5 MCG/ML BUPIVACAINE 1/10 % EPIDURAL INFUSION (WH - ANES)
14.0000 mL/h | INTRAMUSCULAR | Status: DC
  Filled 2011-07-30: qty 60

## 2011-07-30 MED ORDER — EPHEDRINE 5 MG/ML INJ
10.0000 mg | INTRAVENOUS | Status: DC | PRN
  Filled 2011-07-30: qty 4

## 2011-07-30 MED ORDER — OXYTOCIN BOLUS FROM INFUSION
500.0000 mL | Freq: Once | INTRAVENOUS | Status: DC
  Filled 2011-07-30: qty 1000
  Filled 2011-07-30: qty 500

## 2011-07-30 MED ORDER — SENNOSIDES-DOCUSATE SODIUM 8.6-50 MG PO TABS
2.0000 | ORAL_TABLET | Freq: Every day | ORAL | Status: DC
  Administered 2011-07-31: 2 via ORAL

## 2011-07-30 MED ORDER — FENTANYL 2.5 MCG/ML BUPIVACAINE 1/10 % EPIDURAL INFUSION (WH - ANES)
INTRAMUSCULAR | Status: DC | PRN
  Administered 2011-07-30: 14 mL/h via EPIDURAL

## 2011-07-30 MED ORDER — TETANUS-DIPHTH-ACELL PERTUSSIS 5-2.5-18.5 LF-MCG/0.5 IM SUSP
0.5000 mL | Freq: Once | INTRAMUSCULAR | Status: DC
  Filled 2011-07-30: qty 0.5

## 2011-07-30 MED ORDER — LACTATED RINGERS IV SOLN: 500.0000 mL | Freq: Once | INTRAVENOUS | Status: DC

## 2011-07-30 MED ORDER — ONDANSETRON HCL 4 MG/2ML IJ SOLN: 4.0000 mg | INTRAMUSCULAR | Status: DC | PRN

## 2011-07-30 MED ORDER — PHENYLEPHRINE 40 MCG/ML (10ML) SYRINGE FOR IV PUSH (FOR BLOOD PRESSURE SUPPORT)
80.0000 ug | PREFILLED_SYRINGE | INTRAVENOUS | Status: DC | PRN
  Filled 2011-07-30: qty 5

## 2011-07-30 MED ORDER — PRENATAL PLUS 27-1 MG PO TABS
1.0000 | ORAL_TABLET | Freq: Every day | ORAL | Status: DC
  Administered 2011-07-31 – 2011-08-01 (×2): 1 via ORAL
  Filled 2011-07-30 (×2): qty 1

## 2011-07-30 MED ORDER — PENICILLIN G POTASSIUM 5000000 UNITS IJ SOLR
5.0000 10*6.[IU] | Freq: Once | INTRAVENOUS | Status: AC
  Administered 2011-07-30: 5 10*6.[IU] via INTRAVENOUS
  Filled 2011-07-30: qty 5

## 2011-07-30 MED ORDER — LACTATED RINGERS IV SOLN
500.0000 mL | INTRAVENOUS | Status: DC | PRN
  Administered 2011-07-30: 500 mL via INTRAVENOUS

## 2011-07-30 MED ORDER — LIDOCAINE HCL (PF) 1 % IJ SOLN
30.0000 mL | INTRAMUSCULAR | Status: DC | PRN
  Filled 2011-07-30 (×2): qty 30

## 2011-07-30 MED ORDER — EPHEDRINE 5 MG/ML INJ
10.0000 mg | INTRAVENOUS | Status: DC | PRN
  Filled 2011-07-30 (×2): qty 4

## 2011-07-30 MED ORDER — ZOLPIDEM TARTRATE 5 MG PO TABS: 5.0000 mg | ORAL_TABLET | Freq: Every evening | ORAL | Status: DC | PRN

## 2011-07-30 MED ORDER — IBUPROFEN 600 MG PO TABS: 600.0000 mg | ORAL_TABLET | Freq: Four times a day (QID) | ORAL | Status: DC | PRN

## 2011-07-30 MED ORDER — CITRIC ACID-SODIUM CITRATE 334-500 MG/5ML PO SOLN: 30.0000 mL | ORAL | Status: DC | PRN

## 2011-07-30 MED ORDER — ONDANSETRON HCL 4 MG PO TABS: 4.0000 mg | ORAL_TABLET | ORAL | Status: DC | PRN

## 2011-07-30 MED ORDER — SIMETHICONE 80 MG PO CHEW: 80.0000 mg | CHEWABLE_TABLET | ORAL | Status: DC | PRN

## 2011-07-30 MED ORDER — OXYCODONE-ACETAMINOPHEN 5-325 MG PO TABS: 1.0000 | ORAL_TABLET | ORAL | Status: DC | PRN

## 2011-07-30 MED ORDER — PHENYLEPHRINE 40 MCG/ML (10ML) SYRINGE FOR IV PUSH (FOR BLOOD PRESSURE SUPPORT)
80.0000 ug | PREFILLED_SYRINGE | INTRAVENOUS | Status: DC | PRN
  Filled 2011-07-30 (×2): qty 5

## 2011-07-30 MED ORDER — LACTATED RINGERS IV SOLN
INTRAVENOUS | Status: DC
  Administered 2011-07-30 (×2): via INTRAVENOUS

## 2011-07-30 MED ORDER — LANOLIN HYDROUS EX OINT: TOPICAL_OINTMENT | CUTANEOUS | Status: DC | PRN

## 2011-07-30 MED ORDER — OXYTOCIN 20 UNITS IN LACTATED RINGERS INFUSION - SIMPLE
1.0000 m[IU]/min | INTRAVENOUS | Status: DC
  Administered 2011-07-30: 12 m[IU]/min via INTRAVENOUS
  Administered 2011-07-30: 2 m[IU]/min via INTRAVENOUS

## 2011-07-30 MED ORDER — OXYCODONE-ACETAMINOPHEN 5-325 MG PO TABS: 2.0000 | ORAL_TABLET | ORAL | Status: DC | PRN

## 2011-07-30 MED ORDER — LIDOCAINE HCL 1.5 % IJ SOLN
INTRAMUSCULAR | Status: DC | PRN
  Administered 2011-07-30 (×2): 5 mL via EPIDURAL

## 2011-07-30 MED ORDER — IBUPROFEN 600 MG PO TABS
600.0000 mg | ORAL_TABLET | Freq: Four times a day (QID) | ORAL | Status: DC
  Administered 2011-07-31 – 2011-08-01 (×7): 600 mg via ORAL
  Filled 2011-07-30 (×7): qty 1

## 2011-07-30 MED ORDER — NALBUPHINE SYRINGE 5 MG/0.5 ML
5.0000 mg | INJECTION | INTRAMUSCULAR | Status: DC | PRN
  Administered 2011-07-30 (×2): 5 mg via INTRAVENOUS
  Filled 2011-07-30 (×3): qty 0.5

## 2011-07-30 MED ORDER — WITCH HAZEL-GLYCERIN EX PADS: 1.0000 "application " | MEDICATED_PAD | CUTANEOUS | Status: DC | PRN

## 2011-07-30 MED ORDER — DIBUCAINE 1 % RE OINT: 1.0000 "application " | TOPICAL_OINTMENT | RECTAL | Status: DC | PRN

## 2011-07-30 MED ORDER — TERBUTALINE SULFATE 1 MG/ML IJ SOLN: 0.2500 mg | Freq: Once | INTRAMUSCULAR | Status: AC | PRN

## 2011-07-30 MED ORDER — PENICILLIN G POTASSIUM 5000000 UNITS IJ SOLR
2.5000 10*6.[IU] | INTRAMUSCULAR | Status: DC
  Administered 2011-07-30 (×2): 2.5 10*6.[IU] via INTRAVENOUS
  Filled 2011-07-30 (×5): qty 2.5

## 2011-07-30 MED ORDER — BENZOCAINE-MENTHOL 20-0.5 % EX AERO: 1.0000 "application " | INHALATION_SPRAY | CUTANEOUS | Status: DC | PRN

## 2011-07-30 MED ORDER — OXYTOCIN 20 UNITS IN LACTATED RINGERS INFUSION - SIMPLE
125.0000 mL/h | Freq: Once | INTRAVENOUS | Status: AC
  Administered 2011-07-30: 500 mL/h via INTRAVENOUS

## 2011-07-30 NOTE — Progress Notes (Signed)
07/30/2011 Jeanette [name]  Interpreter  I assisted Dr Adrian Blackwater with explanation of care plan.

## 2011-07-30 NOTE — Progress Notes (Signed)
Subjective: Feeling more pressure.    Objective: BP 116/50  Pulse 94  Temp(Src) 98.5 F (36.9 C) (Oral)  Resp 18  Ht 5\' 3"  (1.6 m)  Wt 119.296 kg (263 lb)  BMI 46.59 kg/m2  SpO2 96%  LMP 10/30/2010      FHT:  FHR: 120s bpm, variability: moderate,  accelerations:  Present,  decelerations:  Absent UC:   regular, every 2-3 minutes SVE:   Dilation: Lip/rim Effacement (%): 100 Station: -2 Exam by:: [employer]: Lab Results  Component Value Date   WBC 9.9 07/30/2011   HGB 11.3* 07/30/2011   HCT 35.3* 07/30/2011   MCV 77.9* 07/30/2011   PLT 339 07/30/2011    Assessment / Plan: Induction of labor due to gestational hypertension,  progressing well on pitocin  Labor: Progressing normally Preeclampsia:  labs stable Fetal Wellbeing:  Category I Pain Control:  Epidural I/D:  penicillin Anticipated MOD:  NSVD  STINSON, JACOB JEHIEL 07/30/2011, 9:13 PM

## 2011-07-30 NOTE — Progress Notes (Signed)
07/30/2011 Jeanette [name]  Interpreter  I assisted [name] with admission questions

## 2011-07-30 NOTE — Anesthesia Preprocedure Evaluation (Signed)
Anesthesia Evaluation  Patient identified by MRN, date of birth, ID band Patient awake  General Assessment Comment  Reviewed: Allergy & Precautions, H&P , Patient's Chart, lab work & pertinent test results  Airway Mallampati: III TM Distance: >3 FB Neck ROM: full    Dental No notable dental hx.    Pulmonary    Pulmonary exam normal       Cardiovascular hypertension, Pt. on medications     Neuro/Psych PSYCHIATRIC DISORDERS    GI/Hepatic negative GI ROS Neg liver ROS    Endo/Other  Morbid obesity  Renal/GU negative Renal ROS  Genitourinary negative   Musculoskeletal negative musculoskeletal ROS (+)   Abdominal (+) obese,   Peds negative pediatric ROS (+)  Hematology negative hematology ROS (+)   Anesthesia Other Findings   Reproductive/Obstetrics (+) Pregnancy                           Anesthesia Physical Anesthesia Plan  ASA: III  Anesthesia Plan: Epidural   Post-op Pain Management:    Induction:   Airway Management Planned:   Additional Equipment:   Intra-op Plan:   Post-operative Plan:   Informed Consent: I have reviewed the patients History and Physical, chart, labs and discussed the procedure including the risks, benefits and alternatives for the proposed anesthesia with the patient or authorized representative who has indicated his/her understanding and acceptance.     Plan Discussed with:   Anesthesia Plan Comments:         Anesthesia Quick Evaluation

## 2011-07-30 NOTE — Progress Notes (Signed)
Subjective: Feeling contractions.  Nubain helps mildly.  Objective: BP 128/73  Pulse 89  Temp(Src) 98.3 F (36.8 C) (Oral)  Resp 20  Ht 5\' 3"  (1.6 m)  Wt 119.296 kg (263 lb)  BMI 46.59 kg/m2  LMP 10/30/2010      FHT:  FHR: 120s bpm, variability: moderate,  accelerations:  Present,  decelerations:  Absent UC:   regular, every 3 minutes SVE:   Dilation: 3 Effacement (%): 50 Station: -2 Exam by:: Flo Shanks RN (Pt requested exam)  Labs: Lab Results  Component Value Date   WBC 9.9 07/30/2011   HGB 11.3* 07/30/2011   HCT 35.3* 07/30/2011   MCV 77.9* 07/30/2011   PLT 339 07/30/2011    Assessment / Plan: Induction of labor due to gestational hypertension,  progressing well on pitocin  Labor: Progressing on Pitocin, will continue to increase then AROM Preeclampsia:  labs stable Fetal Wellbeing:  Category I Pain Control:  nubain I/D:  penicillin Anticipated MOD:  NSVD  STINSON, JACOB JEHIEL 07/30/2011, 5:06 PM

## 2011-07-30 NOTE — Progress Notes (Signed)
Subjective: Used interpreter Rosey Bath.  Very uncomfortable.  Would like Epidural.  Objective: BP 140/77  Pulse 94  Temp(Src) 98 F (36.7 C) (Oral)  Resp 20  Ht 5\' 3"  (1.6 m)  Wt 119.296 kg (263 lb)  BMI 46.59 kg/m2  LMP 10/30/2010      FHT:  FHR: 120s bpm, variability: moderate,  accelerations:  Present,  decelerations:  Absent UC:   regular, every 2-3 minutes SVE:   Dilation: 5.5 Effacement (%): 100 Station: -2 Exam by:: B.Smith RN  Labs: Lab Results  Component Value Date   WBC 9.9 07/30/2011   HGB 11.3* 07/30/2011   HCT 35.3* 07/30/2011   MCV 77.9* 07/30/2011   PLT 339 07/30/2011    Assessment / Plan: Induction of labor due to gestational hypertension,  progressing well on pitocin  Labor: Will place epidural, then IUPC to monitor contractions. Preeclampsia:  labs stable Fetal Wellbeing:  Category I Pain Control:  nubain I/D:  penicillin Anticipated MOD:  NSVD  Jeanette [name] 07/30/2011, 7:24 PM

## 2011-07-30 NOTE — Progress Notes (Signed)
Subjective: Continues to feel contractions  Objective: BP 135/80  Pulse 88  Temp(Src) 98.3 F (36.8 C) (Oral)  Resp 20  Ht 5\' 3"  (1.6 m)  Wt 119.296 kg (263 lb)  BMI 46.59 kg/m2  LMP 10/30/2010      FHT:  FHR: 120s bpm, variability: moderate,  accelerations:  Present,  decelerations:  Absent UC:   regular, every 3 minutes SVE:   Dilation: 4 Effacement (%): 100 Station: -2 Exam by:: Dr Adrian Blackwater  Labs: Lab Results  Component Value Date   WBC 9.9 07/30/2011   HGB 11.3* 07/30/2011   HCT 35.3* 07/30/2011   MCV 77.9* 07/30/2011   PLT 339 07/30/2011    Assessment / Plan: AROM with clear fluid.  Head engaging the cervix.  Labor: Progressing normally Preeclampsia:  labs stable Fetal Wellbeing:  Category I Pain Control:  nubain I/D:  penicillin Anticipated MOD:  NSVD  STINSON, JACOB JEHIEL 07/30/2011, 5:26 PM

## 2011-07-30 NOTE — H&P (Addendum)
Jeanette [name] is a 35 y.o. female presenting for IOL.  History This is a 35 year old G3 P2 002 at 39 weeks and 0 days who presents to L&D for induction of labor for gestational hypertension. The patient wishes for a trial of labor after cesarean section. She has mild contractions there are 5-8 minutes apart. She denies rupture membranes. She has a proven pelvis to 6.5 pounds. Her last infant was 10 pounds was delivered via low transverse cesarean section.  The interpreter Jeanette [name] assisted in the admission of this patient.   OB History    Grav Para Term Preterm Abortions TAB SAB Ect Mult Living   3 2 2  0      2     Past Medical History  Diagnosis Date  . Migraine headache 06/13/2011  . Hypertension     only with pregnancy  . Pregnancy induced hypertension    Past Surgical History  Procedure Date  . Cesarean section 2004   Family History: family history includes Birth defects in her daughter; Diabetes in her father and paternal grandmother; and Hypertension in her mother.  There is no history of Anesthesia problems, and Hypotension, and Malignant hyperthermia, and Pseudochol deficiency, . Social History:  reports that she has never smoked. She does not have any smokeless tobacco history on file. She reports that she does not drink alcohol or use illicit drugs.  Review of Systems  All other systems reviewed and are negative.    Dilation: 2 Effacement (%): 90 Station: -3 Blood pressure 123/73, pulse 86, temperature 97.6 F (36.4 C), temperature source Oral, resp. rate 20, height 5\' 3"  (1.6 m), weight 119.296 kg (263 lb), last menstrual period 10/30/2010. Maternal Exam:  Uterine Assessment: Contraction strength is mild.  Contraction duration is 30 seconds. Contraction frequency is irregular.   Abdomen: Surgical scars: low transverse.   Fundal height is Term.   Estimated fetal weight is 9 pounds.   Fetal presentation: vertex  Introitus: Normal vulva. Normal vagina.  Pelvis:  adequate for delivery.   Cervix: Cervix evaluated by digital exam.     Fetal Exam Fetal Monitor Review: Mode: fetoscope.   Baseline rate: 120.  Variability: moderate (6-25 bpm).   Pattern: accelerations present and no decelerations.    Fetal State Assessment: Category I - tracings are normal.     Physical Exam  Constitutional: She is oriented to person, place, and time. She appears well-developed and well-nourished.  HENT:  Head: Normocephalic and atraumatic.  Neck: Normal range of motion. Neck supple.  Cardiovascular: Normal rate and regular rhythm.   Respiratory: Breath sounds normal. No respiratory distress. She has no wheezes. She has no rales. She exhibits no tenderness.  GI: Soft. Bowel sounds are normal. She exhibits no distension and no mass. There is no tenderness. There is no rebound and no guarding.  Neurological: She is alert and oriented to person, place, and time.  Skin: Skin is warm and dry.    Prenatal labs: ABO, Rh: --/--/A positive (08/07 1610) Antibody: Negative (04/30 0000) Rubella: immune (08/07 0921) RPR: negative (08/07 0921)  HBsAg: Negative (04/30 0000)  HIV: negative (08/07 0921)  GBS: Positive (10/04 0000)  1 hr Glucola: 163 3 hr Glucola: 86, 210, 135, 64  Assessment/Plan: #84  35 year old G3 P2 002 at 39 weeks and 0 days. #2 GBS positive #3 trial of labor after cesarean section #4 gestational hypertension   I discussed at length the risks of having a trial of labor after cesarean section, including risks  of shoulder dystocia and uterine rupture.  The patient acknowledged this.  Consent was signed.  We'll admit the patient to labor and delivery.  We'll start IV and start penicillin for GBS prophylaxis. We will also start Pitocin for induction of labor. We will keep patient n.p.o. We will continue with patient's labetalol and monitor the patient a pressure. Expect vaginal delivery.    Jeanette [name] Jeanette [name] 07/30/2011, 9:31 AM

## 2011-07-30 NOTE — Anesthesia Procedure Notes (Signed)
Epidural Patient location during procedure: OB Start time: 07/30/2011 8:22 PM End time: 07/30/2011 8:27 PM Reason for block: procedure for pain  Staffing Anesthesiologist: Sandrea Hughs Performed by: anesthesiologist   Preanesthetic Checklist Completed: patient identified, site marked, surgical consent, pre-op evaluation, timeout performed, IV checked, risks and benefits discussed and monitors and equipment checked  Epidural Patient position: sitting Prep: site prepped and draped and DuraPrep Patient monitoring: continuous pulse ox and blood pressure Approach: midline Injection technique: LOR air  Needle:  Needle type: Tuohy  Needle gauge: 17 G Needle length: 9 cm Needle insertion depth: 7 cm Catheter type: closed end flexible Catheter size: 19 Gauge Catheter at skin depth: 12 cm Test dose: negative and 1.5% lidocaine  Assessment Sensory level: T8 Events: blood not aspirated, injection not painful, no injection resistance, negative IV test and no paresthesia

## 2011-07-31 MED ORDER — LABETALOL HCL 100 MG PO TABS
100.0000 mg | ORAL_TABLET | Freq: Once | ORAL | Status: DC
  Filled 2011-07-31: qty 1

## 2011-07-31 NOTE — Anesthesia Postprocedure Evaluation (Signed)
Anesthesia Post Note  Patient: Jeanette [name]  Procedure(s) Performed: * No procedures listed *  Anesthesia type: Epidural  Patient location: Mother/Baby  Post pain: Pain level controlled  Post assessment: Post-op Vital signs reviewed  Last Vitals:  Filed Vitals:   07/31/11 0450  BP: 117/78  Pulse: 92  Temp: 36.6 C  Resp: 18    Post vital signs: Reviewed  Level of consciousness: awake  Complications: No apparent anesthesia complications

## 2011-07-31 NOTE — Anesthesia Postprocedure Evaluation (Signed)
Anesthesia Post Note  Patient: Jeanette [name]  Procedure(s) Performed: * No procedures listed *  Anesthesia type: Epidural  Patient location: Mother/Baby  Post pain: Pain level controlled  Post assessment: Post-op Vital signs reviewed  Last Vitals:  Filed Vitals:   07/31/11 0450  BP: 117/78  Pulse: 92  Temp: 36.6 C  Resp: 18    Post vital signs: Reviewed  Level of consciousness: awake  Complications: No apparent anesthesia complications 

## 2011-07-31 NOTE — Progress Notes (Signed)
UR chart review completed.  

## 2011-07-31 NOTE — Progress Notes (Signed)
07/31/2011 Jeanette [name]  Interpreter  Stopped by to check and see if patient needed assistance.

## 2011-07-31 NOTE — Progress Notes (Signed)
Post Partum Day 1, successful VBAC. Subjective: no complaints. States bleeding is light, and she has no pain. Is breastfeeding. Has not decided birth control at this time.  Objective: Blood pressure 117/78, pulse 92, temperature 97.8 F (36.6 C), temperature source Oral, resp. rate 18, height 5\' 3"  (1.6 m), weight 119.296 kg (263 lb), last menstrual period 10/30/2010, SpO2 97.00%, unknown if currently breastfeeding.  Physical Exam:  General: alert, no distress and moderately obese Heart: RRR, no murmur Lungs: CTA B/L Abd: +BS throughout, obese, nontender Uterine Fundus: firm DVT Evaluation: No evidence of DVT seen on physical exam.   Basename 07/30/11 1345  HGB 11.3*  HCT 35.3*    Assessment/Plan: Plan for discharge tomorrow. Breast and formula feeding. Undecided birth control at this time; plans for at 6 weeks. Discussed pelvic rest/no intercourse or use of condoms.   LOS: 1 day   Kandance Yano N 07/31/2011, 9:41 AM

## 2011-07-31 NOTE — Addendum Note (Signed)
Addendum  created 07/31/11 0805 by Doreene Burke   Modules edited:Charges VN, Notes Section

## 2011-08-01 NOTE — Discharge Summary (Signed)
Obstetric Discharge Summary Reason for Admission: induction of labor Prenatal Procedures: ultrasound Intrapartum Procedures: spontaneous vaginal delivery;  Postpartum Procedures: none Complications-Operative and Postpartum: none Hemoglobin  Date Value Range Status  07/30/2011 11.3* 12.0-15.0 (g/dL) Final     HCT  Date Value Range Status  07/30/2011 35.3* 36.0-46.0 (%) Final  05/14/2011 35   Final     performed at [facility] Course: Jeanette [name] is a 35 year old [id number] who presented at 39.0 weeks for induction of labor for gestational hypertension. Her BP was controlled on labetalol before delivery. She was induced with pitocin and has a successful VBAC. Her postpartum blood pressures were within normal limits. She will follow-up with high risk clinic.    Discharge Diagnoses: Term Pregnancy-delivered  Discharge Information: Date: 08/01/2011 Activity: pelvic rest Diet: routine Medications: None Condition: stable Instructions: refer to practice specific booklet Discharge to: home, Follow-up at [facility] on 08/30/11   Newborn Data: Live born female  Birth Weight: 8 lb 8.7 oz (3875 g) APGAR: 9, 9  Home with mother.  Jeanette [name] 08/01/2011, 8:40 AM

## 2011-08-01 NOTE — Progress Notes (Signed)
08/01/2011 Jeanette [name]  Interpreter  I assisted [name] RN with discharge instructions  

## 2011-08-01 NOTE — Progress Notes (Signed)
Post Partum Day 2 s/p SVD  Subjective: no complaints, up ad lib, voiding and tolerating PO  Objective: Temp:  [98.1 F (36.7 C)-98.4 F (36.9 C)] 98.4 F (36.9 C) (10/25 0525) Pulse Rate:  [73-94] 73  (10/25 0525) Resp:  [20] 20  (10/25 0525) BP: (120-122)/(80-85) 122/80 mmHg (10/25 0525)   Physical Exam:  General: alert, cooperative, no distress and morbidly obese Lochia: appropriate Uterine Fundus: firm DVT Evaluation: No evidence of DVT seen on physical exam.   Basename 07/30/11 1345  HGB 11.3*  HCT 35.3*    Assessment/Plan: Discharge home and Breastfeeding; Patient to follow up in 6 weeks in [place]   LOS: 2 days   Mat Carne 08/01/2011, 7:26 AM

## 2011-08-01 NOTE — Progress Notes (Signed)
08/01/2011 Tonie Elsey  Interpreter  I stopped by to check on patient's needs

## 2011-08-03 LAB — CULTURE, OB URINE: Colony Count: 50000

## 2011-08-08 NOTE — Progress Notes (Signed)
NST reactive from 07-11-11. 

## 2012-08-06 IMAGING — US US OB FOLLOW-UP
1 series · 14 of 28 positions shown · non-contrast
Comparison: none

[Series 1: us ob follow-up · 14 of 31 slices shown]
[im 2/31]
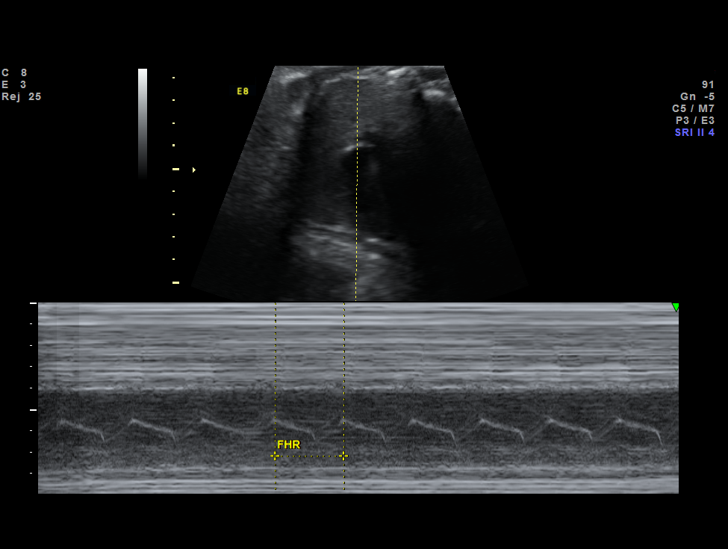
[im 4/31]
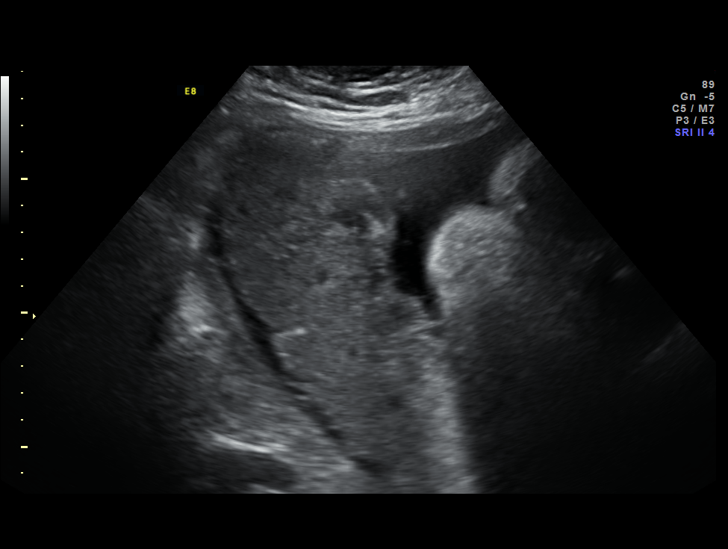
[im 6/31]
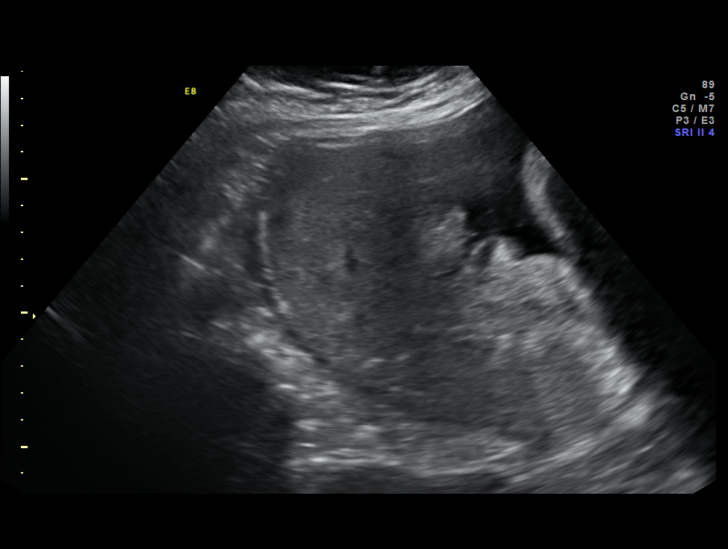
[im 8/31]
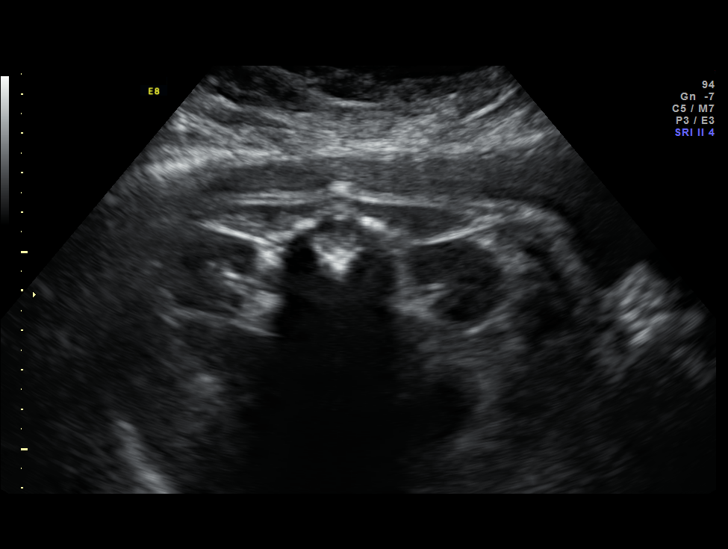
[im 11/31]
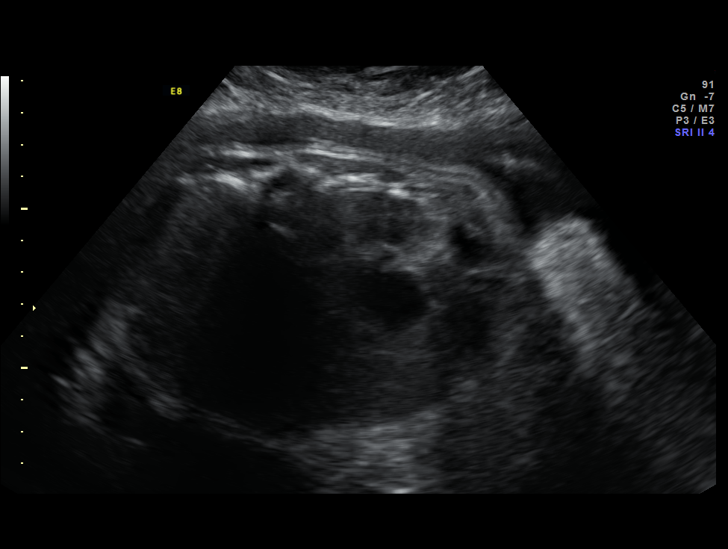
[im 13/31]
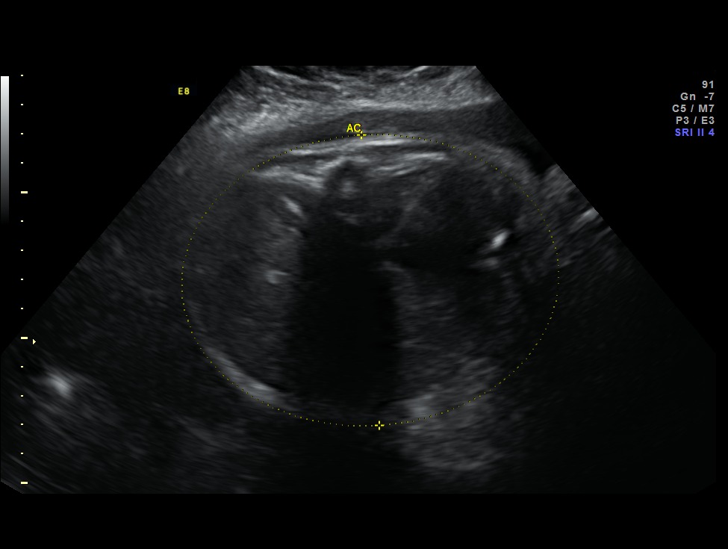
[im 15/31]
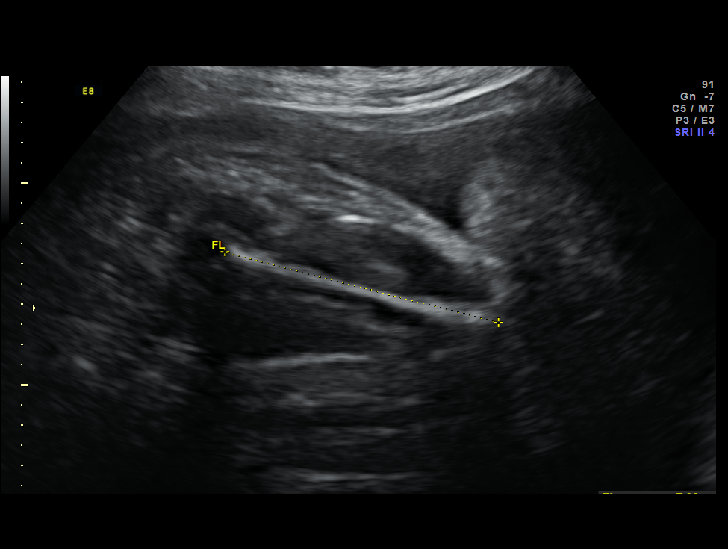
[im 17/31]
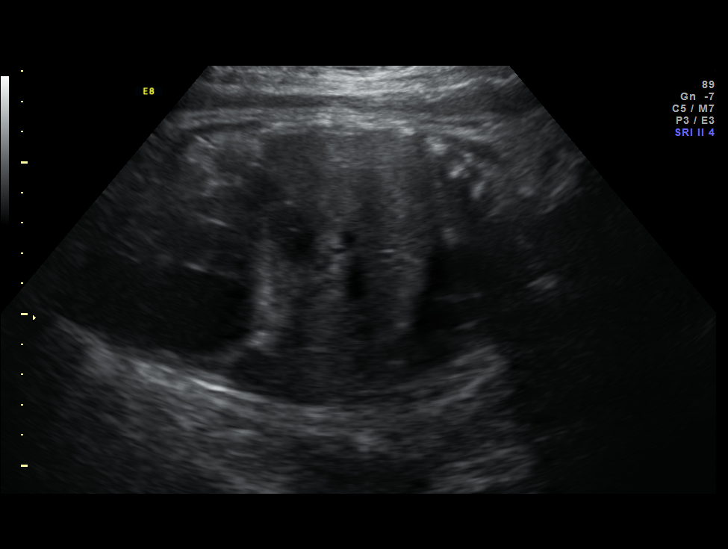
[im 19/31]
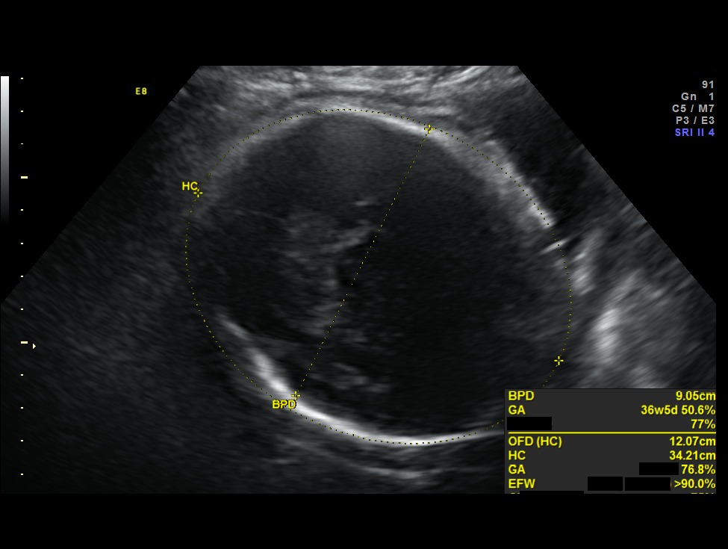
[im 22/31]
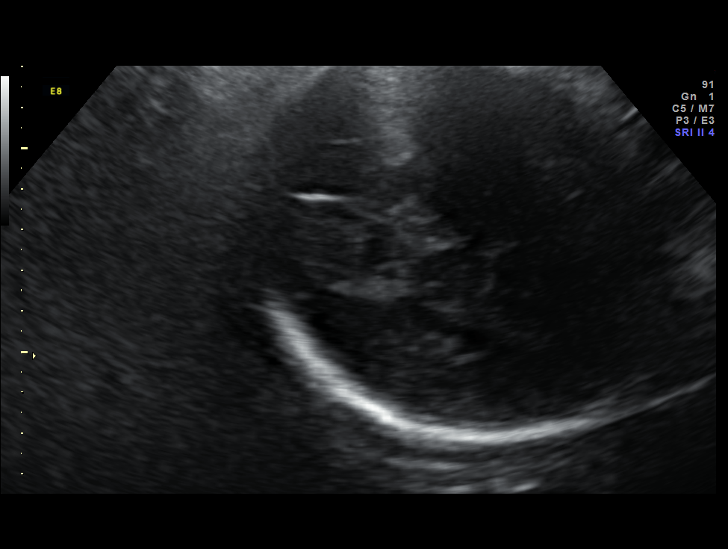
[im 24/31]
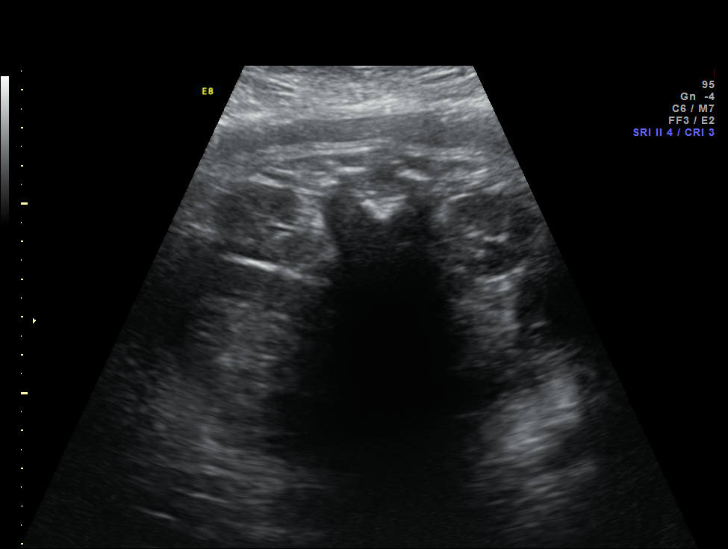
[im 26/31]
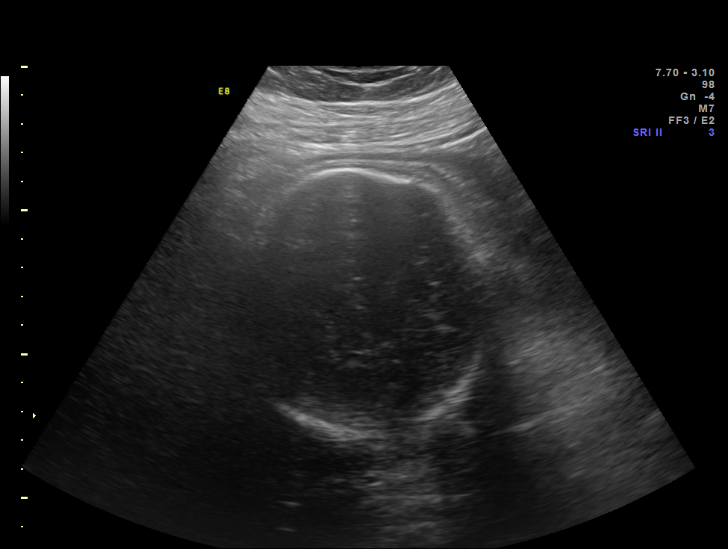
[im 28/31]
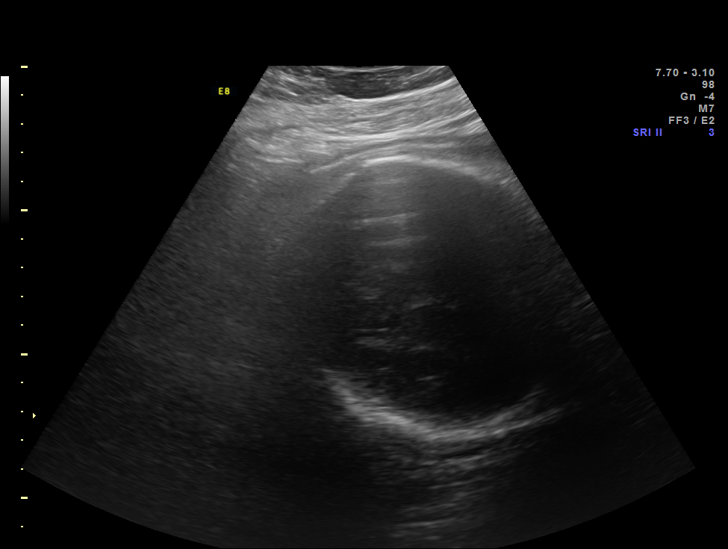
[im 31/31]
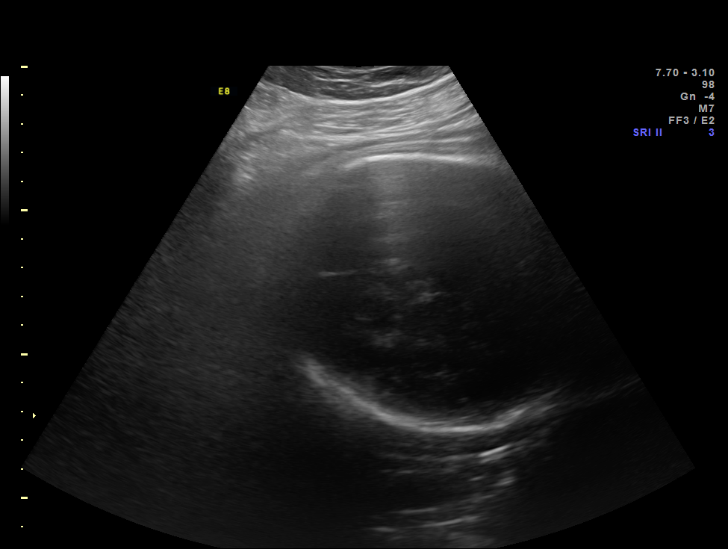

[14 of 28 positions shown; findings below may reference images not displayed]

Canned report from images found in remote index.

Refer to host system for actual result text.

## 2013-12-16 LAB — CYTOLOGY - PAP: Pap: NEGATIVE

## 2014-08-08 ENCOUNTER — Encounter (HOSPITAL_COMMUNITY): Payer: Self-pay

## 2014-10-07 NOTE — L&D Delivery Note (Cosign Needed)
Delivery Note At 2:09 AM a viable female was delivered via  (Presentation: LOA;  ).  APGAR: per nursing documentation; weight  pending.   Placenta status: intact, spontaneous.  Cord: 3 vessel with the following complications: none.   Anesthesia: Epidural  Episiotomy: None Lacerations: None Est. Blood Loss (mL):  125  Mom to postpartum.  Baby to Couplet care / Skin to Skin.  Lowanda Foster 05/11/2015, 2:22 AM   Patient is a [id number] at [redacted]w[redacted]d who was admitted for IOL due to A2/B GDM & cHTN.  She progressed with augmentation via AROM/Pitocin.  I was gloved and present for delivery in its entirety.  Second stage of labor progressed, no decels during second stage noted.  Complications: none  Lacerations: none   Dolly Harbach, CNM 2:35 AM 05/11/2015

## 2014-11-14 ENCOUNTER — Other Ambulatory Visit (HOSPITAL_COMMUNITY): Payer: Self-pay | Admitting: Obstetrics and Gynecology

## 2014-11-14 DIAGNOSIS — O09522 Supervision of elderly multigravida, second trimester: Secondary | ICD-10-CM

## 2014-11-14 DIAGNOSIS — O336XX0 Maternal care for disproportion due to hydrocephalic fetus, not applicable or unspecified: Secondary | ICD-10-CM

## 2014-11-14 DIAGNOSIS — O34219 Maternal care for unspecified type scar from previous cesarean delivery: Secondary | ICD-10-CM

## 2014-11-14 LAB — OB RESULTS CONSOLE HEPATITIS B SURFACE ANTIGEN: Hepatitis B Surface Ag: NEGATIVE

## 2014-11-14 LAB — OB RESULTS CONSOLE HGB/HCT, BLOOD
HEMATOCRIT: 39 %
HEMOGLOBIN: 11.9 g/dL

## 2014-11-14 LAB — OB RESULTS CONSOLE ABO/RH: RH TYPE: POSITIVE

## 2014-11-14 LAB — OB RESULTS CONSOLE RUBELLA ANTIBODY, IGM: Rubella: IMMUNE

## 2014-11-14 LAB — OB RESULTS CONSOLE RPR: RPR: NONREACTIVE

## 2014-11-14 LAB — OB RESULTS CONSOLE ANTIBODY SCREEN: Antibody Screen: NEGATIVE

## 2014-11-14 LAB — OB RESULTS CONSOLE GC/CHLAMYDIA
Chlamydia: NEGATIVE
Gonorrhea: NEGATIVE

## 2014-11-14 LAB — GLUCOSE TOLERANCE, 1 HOUR (50G) W/O FASTING: Glucose, GTT - 1 Hour: 153 mg/dL (ref ?–200)

## 2014-11-14 LAB — CULTURE, OB URINE: Urine Culture, OB: NEGATIVE

## 2014-11-14 LAB — CYTOLOGY - PAP: PAP SMEAR: NEGATIVE

## 2014-11-14 LAB — SICKLE CELL SCREEN: Sickle Cell Screen: NORMAL

## 2014-11-14 LAB — OB RESULTS CONSOLE VARICELLA ZOSTER ANTIBODY, IGG: VARICELLA IGG: IMMUNE

## 2014-11-15 LAB — OB RESULTS CONSOLE HIV ANTIBODY (ROUTINE TESTING): HIV: NONREACTIVE

## 2014-11-22 ENCOUNTER — Ambulatory Visit (HOSPITAL_COMMUNITY)
Admission: RE | Admit: 2014-11-22 | Discharge: 2014-11-22 | Disposition: A | Discharge status: Home / Self Care | Payer: Self-pay | Source: Ambulatory Visit | Attending: Obstetrics and Gynecology | Admitting: Obstetrics and Gynecology

## 2014-11-22 ENCOUNTER — Encounter (HOSPITAL_COMMUNITY): Payer: Self-pay

## 2014-11-22 ENCOUNTER — Ambulatory Visit (HOSPITAL_COMMUNITY): Admission: RE | Admit: 2014-11-22 | Payer: Self-pay | Source: Ambulatory Visit

## 2014-11-22 VITALS — BP 129/77 | HR 92 | Wt 254.2 lb

## 2014-11-22 DIAGNOSIS — Z8279 Family history of other congenital malformations, deformations and chromosomal abnormalities: Secondary | ICD-10-CM

## 2014-11-22 DIAGNOSIS — Z0489 Encounter for examination and observation for other specified reasons: Secondary | ICD-10-CM

## 2014-11-22 DIAGNOSIS — Z36 Encounter for antenatal screening of mother: Secondary | ICD-10-CM | POA: Insufficient documentation

## 2014-11-22 DIAGNOSIS — O09522 Supervision of elderly multigravida, second trimester: Secondary | ICD-10-CM

## 2014-11-22 DIAGNOSIS — IMO0002 Reserved for concepts with insufficient information to code with codable children: Secondary | ICD-10-CM

## 2014-11-22 DIAGNOSIS — O352XX0 Maternal care for (suspected) hereditary disease in fetus, not applicable or unspecified: Secondary | ICD-10-CM | POA: Insufficient documentation

## 2014-11-22 DIAGNOSIS — O34219 Maternal care for unspecified type scar from previous cesarean delivery: Secondary | ICD-10-CM

## 2014-11-22 DIAGNOSIS — O3421 Maternal care for scar from previous cesarean delivery: Secondary | ICD-10-CM | POA: Insufficient documentation

## 2014-11-22 DIAGNOSIS — O09292 Supervision of pregnancy with other poor reproductive or obstetric history, second trimester: Secondary | ICD-10-CM | POA: Insufficient documentation

## 2014-11-22 DIAGNOSIS — O99212 Obesity complicating pregnancy, second trimester: Secondary | ICD-10-CM | POA: Insufficient documentation

## 2014-11-22 DIAGNOSIS — Z3A14 14 weeks gestation of pregnancy: Secondary | ICD-10-CM | POA: Insufficient documentation

## 2014-11-22 DIAGNOSIS — Z3689 Encounter for other specified antenatal screening: Secondary | ICD-10-CM | POA: Insufficient documentation

## 2014-11-22 DIAGNOSIS — O336XX0 Maternal care for disproportion due to hydrocephalic fetus, not applicable or unspecified: Secondary | ICD-10-CM

## 2014-11-23 LAB — GLUCOSE TOLERANCE, 3 HOURS
GLUCOSE FASTING GTT: 114 mg/dL — AB (ref 80–110)
Glucose, GTT - 1 Hour: 209 mg/dL — AB (ref ?–200)

## 2014-11-24 ENCOUNTER — Encounter: Payer: Self-pay | Admitting: *Deleted

## 2014-11-24 DIAGNOSIS — O09529 Supervision of elderly multigravida, unspecified trimester: Secondary | ICD-10-CM | POA: Insufficient documentation

## 2014-11-24 DIAGNOSIS — Z8279 Family history of other congenital malformations, deformations and chromosomal abnormalities: Secondary | ICD-10-CM | POA: Insufficient documentation

## 2014-11-24 NOTE — Progress Notes (Signed)
Genetic Counseling  High-Risk Gestation Note  Appointment Date:  11/22/2014 Referred By: Ferman Hamming, MD Date of Birth:  October 21, 1975   Pregnancy History: [id number] Estimated Date of Delivery: 05/17/15 Estimated Gestational Age: [redacted]w[redacted]d Attending: Rema Fendt, MD   Ms. Jeanette [name] was seen for genetic counseling because of a maternal age of 39 y.o. and previous daughter with hydrocephalus. [facility] Spanish/English medical interpreter, Jeanette [name], provided interpretation for today's visit. [employer] Genetic Counseling Intern, Jeanette [name], assisted with genetic counseling under my direct supervision.     She was counseled regarding maternal age and the association with risk for chromosome conditions due to nondisjunction with aging of the ova.   We reviewed chromosomes, nondisjunction, and the associated 1 in 88 risk for fetal aneuploidy related to a maternal age of 38 y.o. at [redacted]w[redacted]d gestation.  She was counseled that the risk for aneuploidy decreases as gestational age increases, accounting for those pregnancies which spontaneously abort.  We specifically discussed Down syndrome (trisomy 16), trisomies 42 and 60, and sex chromosome aneuploidies (47,XXX and 47,XXY) including the common features and prognoses of each.   We reviewed available screening options including Quad screen, noninvasive prenatal screening (NIPS)/cell free DNA (cfDNA) testing, and detailed ultrasound.  She was counseled that screening tests are used to modify a patient's a priori risk for aneuploidy, typically based on age. This estimate provides a pregnancy specific risk assessment. We reviewed the benefits and limitations of each option. Specifically, we discussed the conditions for which each test screens, the detection rates, and false positive rates of each. Ms. Brossart previously had Quad screen drawn through her OB office. However, ultrasound performed today indicated that pregnancy to be [redacted]w[redacted]d, changing the  estimated date of delivery to 05/17/15. Thus, her Quad screen was drawn too early for interpretation. Detailed ultrasound is scheduled for 12/12/14. Complete ultrasound results from today reported under separate cover.   She was also counseled regarding diagnostic testing via amniocentesis. We reviewed the approximate 1 in 300-500 risk for complications for amniocentesis, including spontaneous pregnancy loss. After consideration of all the options, she elected to proceed with Quad screen, which can be performed between 15 and [redacted] weeks gestation. Ms. Littleton indicated that she was returning to her OB tomorrow (11/23/14) for lab work and would request her Jeanette [name] screen be redrawn at that time. She declined NIPS at this time due to the cost of the testing, and declined amniocentesis given the associated risk for complications. She understands that screening tests cannot rule out all birth defects or genetic syndromes. The patient was advised of this limitation and states she still does not want additional testing at this time.   Ms. Acoff was provided with written information regarding sickle cell anemia (SCA) including the carrier frequency and incidence in the Hispanic population, the availability of carrier testing and prenatal diagnosis if indicated.  In addition, we discussed that hemoglobinopathies are routinely screened for as part of the [place] newborn screening panel.  She declined discussion of this screening today.   Both family histories were updated. Ms. Cua was previously seen for genetic counseling on 03/12/2011 given the couple's daughter's history of hydrocephalus. The patient previously reported that their second daughter, Jeanette [name], was born in [place] and found postnatally to have hydrocephalus. Please see previous genetic counseling note for detailed family history report. She reported that her daughter is currently 4 years old and has less frequent seizures than she  did in the past. She is [facility] and sees an eye specialist locally due to vision problems. To the patient's knowledge, no underlying genetic cause has been determined for her daughter's features. 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 The records do not indicate that she has had a medical genetics evaluation, and no underlying genetic etiology is reported. Ms. Jeanette [name] reported a maternal first cousin (her maternal uncle's daughter) who died at 201-692 years of age due to hydrocephalus. An underlying cause was not known for this individual.   We reviewed that hydrocephalus can be isolated (nonsyndromic) or seen as one feature of an underlying chromosome or genetic condition. Hydrocephalus is typically isolated and multifactorial, involving a combination of genetic and environmental contributing factors.  Rarely, nonsyndromic hydrocephalus can follow autosomal recessive or autosomal dominant inheritance. X-linked hydrocephalus is also observed in some families. We discussed that when isolated and when multifactorial inheritance is suspected, recurrence risk for full siblings is approximately 1-2%. Targeted ultrasound is available to assess for features of hydrocephalus prenatally. However, the patient understands that ultrasound cannot diagnose or rule out all birth defects prenatally. In the absence of an identified genetic cause, additional prenatal screening or testing, such as amniocentesis would not be expected to be informative regarding hydrocephalus. Please see previous genetic counseling note for additional discussion regarding this family history.    The family histories were otherwise found to be  noncontributory for birth defects, intellectual disability, and known genetic conditions. Without further information regarding the provided family history, an accurate genetic risk cannot be calculated. Further genetic counseling is warranted if more information is obtained.  Ms. Jeanette [name] denied exposure to environmental toxins or chemical agents. She denied the use of alcohol, tobacco or street drugs. She denied significant viral illnesses during the course of her pregnancy. Her medical and surgical histories were noncontributory.   I counseled Ms. Jeanette [name] regarding the above risks and available options.  The approximate face-to-face time with the genetic counselor was 50 minutes.  Jeanette PlowmanKaren Marnae Madani, MS,  Certified [employer] Counsel[employer] 11/24/2014

## 2014-12-09 ENCOUNTER — Ambulatory Visit (HOSPITAL_COMMUNITY): Payer: Medicaid Other

## 2014-12-12 ENCOUNTER — Encounter: Payer: Self-pay | Attending: Family Medicine | Admitting: *Deleted

## 2014-12-12 ENCOUNTER — Encounter (HOSPITAL_COMMUNITY): Payer: Self-pay

## 2014-12-12 ENCOUNTER — Other Ambulatory Visit (HOSPITAL_COMMUNITY): Payer: Self-pay | Admitting: Obstetrics and Gynecology

## 2014-12-12 ENCOUNTER — Encounter: Payer: Self-pay | Admitting: Family Medicine

## 2014-12-12 ENCOUNTER — Ambulatory Visit (HOSPITAL_COMMUNITY)
Admission: RE | Admit: 2014-12-12 | Discharge: 2014-12-12 | Disposition: A | Discharge status: Home / Self Care | Payer: Self-pay | Source: Ambulatory Visit | Attending: Maternal and Fetal Medicine | Admitting: Maternal and Fetal Medicine

## 2014-12-12 ENCOUNTER — Ambulatory Visit (INDEPENDENT_AMBULATORY_CARE_PROVIDER_SITE_OTHER): Payer: Self-pay | Admitting: Family Medicine

## 2014-12-12 VITALS — BP 118/88 | HR 101 | Temp 98.0°F | Wt 249.0 lb

## 2014-12-12 DIAGNOSIS — Z3A21 21 weeks gestation of pregnancy: Secondary | ICD-10-CM

## 2014-12-12 DIAGNOSIS — O0990 Supervision of high risk pregnancy, unspecified, unspecified trimester: Secondary | ICD-10-CM | POA: Insufficient documentation

## 2014-12-12 DIAGNOSIS — O09522 Supervision of elderly multigravida, second trimester: Secondary | ICD-10-CM

## 2014-12-12 DIAGNOSIS — O3421 Maternal care for scar from previous cesarean delivery: Secondary | ICD-10-CM

## 2014-12-12 DIAGNOSIS — Z789 Other specified health status: Secondary | ICD-10-CM

## 2014-12-12 DIAGNOSIS — O99212 Obesity complicating pregnancy, second trimester: Secondary | ICD-10-CM | POA: Insufficient documentation

## 2014-12-12 DIAGNOSIS — IMO0002 Reserved for concepts with insufficient information to code with codable children: Secondary | ICD-10-CM

## 2014-12-12 DIAGNOSIS — O34219 Maternal care for unspecified type scar from previous cesarean delivery: Secondary | ICD-10-CM

## 2014-12-12 DIAGNOSIS — Z0489 Encounter for examination and observation for other specified reasons: Secondary | ICD-10-CM | POA: Insufficient documentation

## 2014-12-12 DIAGNOSIS — O24112 Pre-existing diabetes mellitus, type 2, in pregnancy, second trimester: Secondary | ICD-10-CM | POA: Insufficient documentation

## 2014-12-12 DIAGNOSIS — Z98891 History of uterine scar from previous surgery: Secondary | ICD-10-CM

## 2014-12-12 DIAGNOSIS — O24119 Pre-existing diabetes mellitus, type 2, in pregnancy, unspecified trimester: Secondary | ICD-10-CM | POA: Insufficient documentation

## 2014-12-12 DIAGNOSIS — E119 Type 2 diabetes mellitus without complications: Secondary | ICD-10-CM | POA: Insufficient documentation

## 2014-12-12 DIAGNOSIS — O09292 Supervision of pregnancy with other poor reproductive or obstetric history, second trimester: Secondary | ICD-10-CM | POA: Insufficient documentation

## 2014-12-12 DIAGNOSIS — O336XX0 Maternal care for disproportion due to hydrocephalic fetus, not applicable or unspecified: Secondary | ICD-10-CM

## 2014-12-12 DIAGNOSIS — O24312 Unspecified pre-existing diabetes mellitus in pregnancy, second trimester: Secondary | ICD-10-CM

## 2014-12-12 DIAGNOSIS — O0992 Supervision of high risk pregnancy, unspecified, second trimester: Secondary | ICD-10-CM

## 2014-12-12 DIAGNOSIS — Z758 Other problems related to medical facilities and other health care: Secondary | ICD-10-CM

## 2014-12-12 DIAGNOSIS — O09299 Supervision of pregnancy with other poor reproductive or obstetric history, unspecified trimester: Secondary | ICD-10-CM

## 2014-12-12 DIAGNOSIS — Z713 Dietary counseling and surveillance: Secondary | ICD-10-CM | POA: Insufficient documentation

## 2014-12-12 DIAGNOSIS — Z3A17 17 weeks gestation of pregnancy: Secondary | ICD-10-CM | POA: Insufficient documentation

## 2014-12-12 DIAGNOSIS — O352XX9 Maternal care for (suspected) hereditary disease in fetus, other fetus: Secondary | ICD-10-CM

## 2014-12-12 LAB — COMPREHENSIVE METABOLIC PANEL
ALT: 12 U/L (ref 0–35)
AST: 14 U/L (ref 0–37)
Albumin: 3.8 g/dL (ref 3.5–5.2)
Alkaline Phosphatase: 63 U/L (ref 39–117)
BILIRUBIN TOTAL: 0.3 mg/dL (ref 0.2–1.2)
BUN: 8 mg/dL (ref 6–23)
CALCIUM: 9.5 mg/dL (ref 8.4–10.5)
CO2: 26 meq/L (ref 19–32)
CREATININE: 0.45 mg/dL — AB (ref 0.50–1.10)
Chloride: 101 mEq/L (ref 96–112)
GLUCOSE: 71 mg/dL (ref 70–99)
Potassium: 4.4 mEq/L (ref 3.5–5.3)
Sodium: 138 mEq/L (ref 135–145)
Total Protein: 7.1 g/dL (ref 6.0–8.3)

## 2014-12-12 LAB — POCT URINALYSIS DIP (DEVICE)
BILIRUBIN URINE: NEGATIVE
GLUCOSE, UA: NEGATIVE mg/dL
HGB URINE DIPSTICK: NEGATIVE
KETONES UR: NEGATIVE mg/dL
Leukocytes, UA: NEGATIVE
Nitrite: NEGATIVE
Protein, ur: NEGATIVE mg/dL
SPECIFIC GRAVITY, URINE: 1.01 (ref 1.005–1.030)
UROBILINOGEN UA: 0.2 mg/dL (ref 0.0–1.0)
pH: 6 (ref 5.0–8.0)

## 2014-12-12 MED ORDER — ASPIRIN 81 MG PO CHEW: 81.0000 mg | CHEWABLE_TABLET | Freq: Every day | ORAL | Status: DC

## 2014-12-12 NOTE — Progress Notes (Signed)
  Patient was seen on 12/12/14 for Gestational Diabetes self-management . Blanca used and interpreter.  The following learning objectives were met by the patient :   States the definition of Gestational Diabetes  States when to check blood glucose levels  Demonstrates proper blood glucose monitoring techniques  States the effect of stress and exercise on blood glucose levels  States the importance of limiting caffeine and abstaining from alcohol and smoking  Plan:  Consider  increasing your activity level by walking daily as tolerated Begin checking BG before breakfast and 2 hours after first bit of breakfast, lunch and dinner after  as directed by MD  Take medication  as directed by MD  Blood glucose monitor given: [employer] Lot # [id number] Exp: 05/06/16 Blood glucose reading: $RemoveBeforeDE'83mg'[id number]$ /dl pp  Patient instructed to monitor glucose levels: FBS: 60 - <90 2 hour: <120  Patient received the following handouts:  Nutrition Diabetes and Pregnancy  Patient will be seen for follow-up as needed.

## 2014-12-12 NOTE — Progress Notes (Signed)
Nutrition note: 1st visit consult & GDM diet education Pt was obese prior to pregnancy & is a newly diagnosed GDM pt. Pt has lost 3# @ 2925w5d & pt reports this is due to her making changes in her diet since she found out she has GDM. Pt reports eating 3 meals & 2-3 snacks/d. Pt is taking a PNV. Pt reports no N/V or heartburn. NKFA. Pt reports walking 30 mins/d, 3-7d/wk. Pt received verbal & written education in Spanish via an interpreter about GDM diet. Discussed wt gain goals of 11-20# or 0.5#/wk. Pt agrees to follow GDM diet with 3 meals & 1-3 snacks/d with proper CHO/ protein combination. Pt has WIC & plans to BF. F/u in 4-6 wks Blondell RevealLaura Daleah Coulson, MS, RD, LDN, [facility]

## 2014-12-12 NOTE — Patient Instructions (Signed)
Diabetes mellitus gestacional (Gestational Diabetes Mellitus) La diabetes mellitus gestacional, ms comnmente conocida como diabetes gestacional es un tipo de diabetes que desarrollan algunas mujeres durante el embarazo. En la diabetes gestacional, el pncreas no produce suficiente insulina (una hormona) o las clulas son menos sensibles a la insulina producida (resistencia a la insulina), o ambas cosas. Normalmente, la insulina mueve los azcares de los alimentos a las clulas de los tejidos. Las clulas de los tejidos utilizan los azcares para obtener energa. La falta de insulina o la falta de una respuesta normal a la insulina hace que el exceso de azcar se acumule en la sangre en lugar de penetrar en las clulas de los tejidos. Como resultado, se producen niveles altos de azcar en la sangre (hiperglucemia). El efecto de los niveles altos de azcar (glucosa) puede causar muchos problemas.  FACTORES DE RIESGO Usted tiene mayor probabilidad de desarrollar diabetes gestacional si tiene antecedentes familiares de diabetes y tambin si tiene uno o ms de los siguientes factores de riesgo:  ndice de masa corporal superior a 30 (obesidad).  Embarazo previo con diabetes gestacional.  La edad avanzada en el momento del embarazo. Si se mantienen los niveles de glucosa en la sangre en un rango normal durante el embarazo, las mujeres pueden tener un embarazo saludable. Si los niveles de glucosa en la sangre no estn bien controlados, puede haber riesgos para usted, el feto o el recin nacido, o durante el trabajo de parto y el parto.  SNTOMAS  Si se presentan sntomas, stos son similares a los sntomas que normalmente experimentar durante el embarazo. Los sntomas de la diabetes gestacional son:   Aumento de la sed (polidipsia).  Aumento de la miccin (poliuria).  Orina con ms frecuencia durante la noche (nocturia).  Prdida de peso. La prdida de peso puede ser muy rpida.  Infecciones  frecuentes y recurrentes.  Cansancio (fatiga).  Debilidad.  Cambios en la visin, como visin borrosa.  Olor a fruta en el aliento.  Dolor abdominal. DIAGNSTICO La diabetes se diagnostica cuando hay aumento de los niveles de glucosa en la sangre. El nivel de glucosa en la sangre puede controlarse en uno o ms de los siguientes anlisis de sangre:  Medicin de glucosa en la sangre en ayunas. No se le permitir comer durante al menos 8 horas antes de que se tome una muestra de sangre.  Pruebas al azar de glucosa en la sangre. El nivel de glucosa en la sangre se controla en cualquier momento del da sin importar el momento en que haya comido.  Prueba de A1c (hemoglobina glucosilada) Una prueba de A1c proporciona informacin sobre el control de la glucosa en la sangre durante los ltimos 3 meses.  Prueba de tolerancia a la glucosa oral (PTGO). La glucosa en la sangre se mide despus de no haber comido (ayunas) durante una a tres horas y despus de beber una bebida que contenga glucosa. Dado que las hormonas que causan la resistencia a la insulina son ms altas alrededor de las semanas 24 a 28 de embarazo, generalmente se realiza una PTGO durante ese tiempo. Si tiene factores de riesgo de diabetes gestacional, su mdico puede hacerle estudios de deteccin antes de las 24semanas de embarazo. TRATAMIENTO   Usted tendr que tomar medicamentos para la diabetes o insulina diariamente para mantener los niveles de glucosa en la sangre en el rango deseado.  Usted tendr que combinar la dosis de insulina con la actividad fsica y la eleccin de alimentos saludables. El objetivo del   tratamiento es mantener el nivel de azcar en la sangre previo a comer (preprandial) y durante la noche entre 60 y 99mg/dl, durante todo el embarazo. El objetivo del tratamiento es mantener el nivel pico de azcar en la sangre despus de comer (glucosa posprandial) entre 100y 140mg/dl. INSTRUCCIONES PARA EL CUIDADO EN EL  HOGAR   Controle su nivel de hemoglobina A1c dos veces al ao.  Contrlese a diario el nivel de glucosa en la sangre segn las indicaciones de su mdico. Es comn realizar controles frecuentes de la glucosa en la sangre.  Supervise las cetonas en la orina cuando est enferma y segn las indicaciones de su mdico.  Tome el medicamento para la diabetes y adminstrese insulina segn las indicaciones de su mdico para mantener el nivel de glucosa en la sangre en el rango deseado.  Nunca se quede sin medicamento para la diabetes o sin insulina. Es necesario que la reciba todos los das.  Ajuste la insulina segn la ingesta de hidratos de carbono. Los hidratos de carbono pueden aumentar los niveles de glucosa en la sangre, pero deben incluirse en su dieta. Los hidratos de carbono aportan vitaminas, minerales y fibra que son una parte esencial de una dieta saludable. Los hidratos de carbono se encuentran en frutas, verduras, cereales integrales, productos lcteos, legumbres y alimentos que contienen azcares aadidos.  Consuma alimentos saludables. Alterne 3 comidas con 3 colaciones.  Aumente de peso saludablemente. El aumento del peso total vara de acuerdo con el ndice de masa corporal que tena antes del embarazo (IM[facility]).  Lleve una tarjeta de alerta mdica o use una pulsera o medalla de alerta mdica.  Lleve con usted una colacin de 15gramos de hidratos de carbono en todo momento para controlar los niveles bajos de glucosa en la sangre (hipoglucemia). Algunos ejemplos de colaciones de 15gramos de hidratos de carbono son los siguientes:  Tabletas de glucosa, 3 o 4.  Gel de glucosa, tubo de 15 gramos.  Pasas de uva, 2 cucharadas (24 g).  Caramelos de goma, 6.  Galletas de animales, 8.  Jugo de fruta, gaseosa comn, o leche descremada, 4 onzas (120 ml).  Pastillas de goma, 9.  Reconocer la hipoglucemia. Durante el embarazo la hipoglucemia se produce cuando hay niveles de glucosa en la  sangre de 60 mg/dl o menos. El riesgo de hipoglucemia aumenta durante el ayuno o cuando se saltea las comidas, durante o despus de realizar ejercicio intenso y mientras duerme. Los sntomas de hipoglucemia son:  Temblores o sacudidas.  Disminucin de la capacidad de concentracin.  Sudoracin.  Aumento de la frecuencia cardaca.  Dolor de cabeza.  Sequedad en la boca.  Hambre.  Irritabilidad.  Ansiedad.  Sueo agitado.  Alteracin del habla o de la coordinacin.  Confusin.  Tratar la hipoglucemia rpidamente. Si usted est alerta y puede tragar con seguridad, siga la regla de 15/15 que consiste en:  Tome entre 15 y 20gramos de glucosa de accin rpida o carbohidratos. Las opciones de accin rpida son un gel de glucosa, tabletas de glucosa, o 4 onzas (120 ml) de jugo de frutas, gaseosa comn, o leche baja en grasa.  Compruebe su nivel de glucosa en la sangre 15 minutos despus de tomar la glucosa.  Tome entre 15 y 20 gramos ms de glucosa si el nivel de glucosa en la sangre todava es de 70mg/dl o inferior.  Ingiera una comida o una colacin en el lapso de 1 hora una vez que los niveles de glucosa en la sangre vuelven   a la normalidad.  Est atento a la poliuria (miccin excesiva) y la polidipsia (sensacin de mucha sed), que son los primeros signos de la hiperglucemia. El reconocimiento temprano de la hiperglucemia permite un tratamiento oportuno. Trate la hiperglucemia segn le indic su mdico.  Haga actividad fsica por lo menos 30minutos al da o como lo indique su mdico. Se recomienda que 30 minutos despus de cada comida, realice diez minutos de actividad fsica para controlar los niveles de glucosa postprandial en la Scappo[place]angre.  Ajuste su dosis de insulina y la ingesta de alimentos, segn sea necesario, si inicia un nuevo ejercicio o deporte.  Siga su plan para los [address] de enfermedad cuando no pueda comer o beber como de [place]costumbre.  Evite el tabaco y el  alcohol.  Concurra a todas las visitas de control como se lo haya indicado el mdico.  Siga el consejo del mdico respecto a los controles prenatales y posteriores al parto (postparto), las visitas, la planificacin de las comidas, el ejercicio, los medicamentos, las vitaminas, los anlisis de [place]re, otras pruebas mdicas y Seabrookactividades fsicas.  Realice diariamente el cuidado de la piel y de los pies. Examine su piel y los pies diariamente para ver si tiene cortes, moretones, enrojecimiento, problemas en las uas, sangrado, ampollas o Advertising account plannerllagas.  Cepllese los dientes y encas por lo menos dos veces al da y use hilo dental al menos una vez por da. Concurra regularmente a las visitas de control con el dentista.  Programe un examen de vista durante el primer trimestre de su embarazo o como lo indique su mdico.  Comparta su plan de control de diabetes en el trabajo o en la escuela.  Mantngase al da con las vacunas.  Aprenda a Dealermanejar el estrs.  Obtenga la mayor cantidad posible de informacin sobre la diabetes y solicite ayuda siempre que sea necesario.  Obtenga informacin sobre el amamantamiento y analice esta posibilidad.  Debe controlar el nivel de azcar en la sangre de 6a 12semanas despus del parto. Esto se hace con una prueba de tolerancia a la glucosa oral (PTGO). SOLICITE ATENCIN MDICA SI:   No puede comer alimentos o beber por ms de 6 horas.  Tuvo nuseas o ha vomitado durante ms de 6 horas.  Tiene un nivel de glucosa en la sangre de 200 mg/dl y cetonas en la orina.  Presenta algn cambio en el estado mental.  Desarrolla problemas de visin.  Sufre un dolor persistente de Training and development officercabeza.  Siente dolor o molestias en la parte superior del abdomen.  Desarrolla una enfermedad grave adicional.  Tuvo diarrea durante ms de 6 horas.  Ha estado enfermo o ha tenido fiebre durante un par [address] das y no mejora. SOLICITE ATENCIN MDICA DE INMEDIATO SI:   Tiene dificultad  para respirar.  Ya no siente los movimientos del beb.  Est sangrando o tiene flujo vaginal.  Comienza a tener contracciones o trabajo de Lawrenceparto prematuro. ASEGRESE DE QUE:  Comprende estas instrucciones.  Controlar su afeccin.  Recibir ayuda de inmediato si no mejora o si empeora. Document Released: 07/03/2005 Document Revised: 02/07/2014 [facility] Patient Information 2015 [place], [place]. This information is not intended to replace advice given to you by your health care provider. Make sure you discuss any questions you have with your health care provider.  Segundo trimestre de Psychiatristembarazo (Second Trimester of Pregnancy) El segundo trimestre va desde la semana13 hasta la 28, desde el cuarto hasta el sexto mes, y suele ser el momento en el que mejor se  siente. Su organismo se ha adaptado a Charity fundraiserestar embarazada y comienza a Diplomatic Services operational officersentirse fsicamente mejor. En general, las nuseas matutinas han disminuido o han desaparecido completamente, p El segundo trimestre es tambin la poca en la que el feto se desarrolla rpidamente. Hacia el final del sexto mes, el feto mide aproximadamente 9pulgadas (23cm) y pesa alrededor de 1 libras (700g). Es probable que sienta que el beb se Teacher, English as a foreign languagemueve (da pataditas) entre las 18 y 20semanas del Psychiatristembarazo. CAMBIOS EN EL ORGANISMO Su organismo atraviesa por muchos cambios durante el Northwoodsembarazo, y estos varan de Neomia Dearuna mujer a Educational psychologistotra.   Seguir American Standard Companiesaumentando de peso. Notar que la parte baja del abdomen sobresale.  Podrn aparecer las primeras Albertson'sestras en las caderas, el abdomen y las Sharpesmamas.  Es posible que tenga dolores de cabeza que pueden aliviarse con los medicamentos que su mdico autorice.  Tal vez tenga necesidad de orinar con ms frecuencia porque el feto est ejerciendo presin Ambulance personsobre la vejiga.  Debido al Vanetta Muldersembarazo podr sentir Anthoney Haradaacidez estomacal con frecuencia.  Puede estar estreida, ya que ciertas hormonas enlentecen los movimientos de los msculos que New York Life Insuranceempujan los  desechos a travs de los intestinos.  Pueden aparecer hemorroides o abultarse e hincharse las venas (venas varicosas).  Puede tener dolor de espalda que se debe al Citigroupaumento de peso y a que las hormonas del Management consultantembarazo relajan las articulaciones entre los huesos de la pelvis, y Public librariancomo consecuencia de la modificacin del peso y los msculos que mantienen el equilibrio.  Las ConAgra Foodsmamas seguirn creciendo y Development worker, communityle dolern.  Las Veterinary surgeonencas pueden sangrar y estar sensibles al cepillado y al hilo dental.  Pueden aparecer zonas oscuras o manchas (cloasma, mscara del Psychiatristembarazo) en el rostro que probablemente se atenuarn despus del nacimiento del beb.  Es posible que se forme una lnea oscura desde el ombligo hasta la zona del pubis (linea nigra) que probablemente se atenuarn despus del nacimiento del beb.  Tal vez haya cambios en el cabello que pueden incluir su engrosamiento, crecimiento rpido y cambios en la textura. Adems, a algunas mujeres se les cae el cabello durante o despus del embarazo, o tienen el cabello seco o fino. Lo ms probable es que el cabello se le normalice despus del nacimiento del beb. QU DEBE ESPERAR EN LAS CONSULTAS PRENATALES Durante una visita prenatal de rutina:  La pesarn para asegurarse de que usted y el feto estn creciendo normalmente.  Le tomarn la presin arterial.  Le medirn el abdomen para controlar el desarrollo del beb.  Se escucharn los latidos cardacos fetales.  Se evaluarn los resultados de los estudios solicitados en visitas anteriores. El mdico puede preguntarle lo siguiente:  Cmo se siente.  Si siente los movimientos del beb.  Si ha tenido sntomas anormales, como prdida de lquido, [place] Ir[place]insangrado, dolores de cabeza intensos o clicos abdominales.  Si tiene Colgate-Palmolivealguna pregunta. Otros estudios que podrn realizarse durante el segundo trimestre incluyen lo siguiente:  Anlisis de sangre para detectar:  Concentraciones de hierro bajas  (anemia).  Diabetes gestacional (entre la semana 24 y la 28).  Anticuerpos Rh.  Anlisis de orina para detectar infecciones, diabetes o protenas en la orina.  Una ecografa para confirmar que el beb crece y se desarrolla correctamente.  Una amniocentesis para diagnosticar posibles problemas genticos.  Estudios del feto para descartar espina bfida y sndrome de [place]. INSTRUCCIONES PARA EL CUIDADO EN EL HOGAR   Evite fumar, consumir hierbas, beber alcohol y tomar frmacos que no le hayan recetado. Estas sustancias qumicas afectan la formacin  y el desarrollo del beb.  Siga las indicaciones del mdico en relacin con el uso de medicamentos. Durante el embarazo, hay medicamentos que son seguros de tomar y otros que no.  Haga actividad fsica solo en la forma indicada por el mdico. Sentir clicos uterinos es un buen signo para Restaurant manager, fast food actividad fsica.  Contine comiendo alimentos que sanos con regularidad.  Use un sostn que le brinde buen soporte si le [employer].  No se d baos de inmersin en agua caliente, baos turcos ni saunas.  Colquese el cinturn de seguridad cuando conduzca.  No coma carne cruda ni queso sin cocinar; evite el contacto con las bandejas sanitarias de los gatos y la tierra que estos animales usan. Estos elementos contienen grmenes que pueden causar defectos congnitos en el beb.  Tome las vitaminas prenatales.  Si est estreida, pruebe un laxante suave (si el mdico lo autoriza). Consuma ms alimentos ricos en fibra, como vegetales y frutas frescos y Radiation protection practitioner. Beba gran cantidad de lquido para mantener la orina de tono claro o color amarillo plido.  Dese baos de asiento con agua tibia para Engineer, materials o las molestias causadas por las hemorroides. Use una crema para las hemorroides si el mdico la autoriza.  Si tiene venas varicosas, use medias de descanso. Eleve los pies durante , 3 o 4veces por da. Limite la  cantidad de sal en su dieta.  No levante objetos pesados, use zapatos de tacones bajos y [address].  Descanse con las piernas elevadas si tiene calambres o dolor de cintura.  Visite a su dentista si an no lo ha Occupational hygienist. Use un cepillo de dientes blando para higienizarse los dientes y psese el hilo dental con suavidad.  Puede seguir [employer], a menos que el mdico le indique lo contrario.  Concurra a todas las visitas prenatales segn las indicaciones de su mdico. SOLICITE ATENCIN MDICA SI:   [place].  Siente clicos leves, presin en la pelvis o dolor persistente en el abdomen.  Tiene nuseas, vmitos o diarrea persistentes.  Tiene secrecin vaginal con mal olor.  Siente dolor al [employer]ocoPhillips. SOLICITE ATENCIN MDICA DE INMEDIATO SI:   Tiene fiebre.  Tiene una prdida de lquido por la vagina.  Tiene sangrado o pequeas prdidas vaginales.  Siente dolor intenso o clicos en el abdomen.  Sube o baja de peso rpidamente.  Tiene dificultad para respirar y siente dolor de pecho.  Sbitamente se le hinchan mucho el rostro, las [place], los tobillos, los pies o las piernas.  No ha sentido los movimientos del beb durante Georgianne Fick.  Siente un dolor de cabeza intenso que no se alivia con medicamentos.  Hay cambios en la visin. Document Released: 07/03/2005 Document Revised: 09/28/2013 [facility] Patient Information 2015 [place], [place]. This information is not intended to replace advice given to you by your health care provider. Make sure you discuss any questions you have with your health care provider.

## 2014-12-12 NOTE — Progress Notes (Signed)
Spanish interpreter: Elane FritzBlanca used [facility]/B, AMA, h/o C-section with baby with hydrocephalus and macrosomia Diabetes education given Desires TOLAC Quad screen today

## 2014-12-12 NOTE — Progress Notes (Signed)
Jeanette [name] used for interpreter Patient has not been given supplies to start checking blood sugars

## 2014-12-13 LAB — AFP, QUAD SCREEN
AFP: 30.5 ng/mL
Curr Gest Age: 17.5 wks.days
Down Syndrome Scr Risk Est: 1:1200 {titer}
HCG TOTAL: 31.59 [IU]/mL
INH: 126.5 pg/mL
Interpretation-AFP: NEGATIVE
MOM FOR HCG: 1.74
MOM FOR INH: 1.21
MoM for AFP: 1.34
Open Spina bifida: NEGATIVE
TRI 18 SCR RISK EST: NEGATIVE
UE3 VALUE: 1.11 ng/mL
uE3 Mom: 0.96

## 2014-12-13 LAB — HEMOGLOBIN A1C
HEMOGLOBIN A1C: 6.2 % — AB (ref <5.7)
Mean Plasma Glucose: 131 mg/dL — ABNORMAL HIGH (ref <117)

## 2014-12-13 LAB — PROTEIN / CREATININE RATIO, URINE
CREATININE, URINE: 39.3 mg/dL
Protein Creatinine Ratio: 0.13 (ref <0.15)
Total Protein, Urine: 5 mg/dL (ref 5–24)

## 2014-12-13 LAB — TSH: TSH: 1.279 u[IU]/mL (ref 0.350–4.500)

## 2014-12-19 ENCOUNTER — Ambulatory Visit (INDEPENDENT_AMBULATORY_CARE_PROVIDER_SITE_OTHER): Payer: Self-pay | Admitting: Family Medicine

## 2014-12-19 VITALS — BP 119/67 | HR 98 | Temp 98.2°F | Wt 247.1 lb

## 2014-12-19 DIAGNOSIS — O0992 Supervision of high risk pregnancy, unspecified, second trimester: Secondary | ICD-10-CM

## 2014-12-19 DIAGNOSIS — O24112 Pre-existing diabetes mellitus, type 2, in pregnancy, second trimester: Secondary | ICD-10-CM

## 2014-12-19 LAB — POCT URINALYSIS DIP (DEVICE)
BILIRUBIN URINE: NEGATIVE
Glucose, UA: NEGATIVE mg/dL
Leukocytes, UA: NEGATIVE
Nitrite: NEGATIVE
PROTEIN: NEGATIVE mg/dL
Urobilinogen, UA: 0.2 mg/dL (ref 0.0–1.0)
pH: 6 (ref 5.0–8.0)

## 2014-12-19 MED ORDER — GLYBURIDE 2.5 MG PO TABS: 2.5000 mg | ORAL_TABLET | Freq: Two times a day (BID) | ORAL | Status: DC

## 2014-12-19 NOTE — Progress Notes (Signed)
Spanish interpreter: Okey Regalarol used FBS 110-128 2 hr pp 85-170 (7 of 16 out of range) Begin Glyburide 2.5 mg bid

## 2014-12-19 NOTE — Patient Instructions (Signed)
Diabetes mellitus gestacional (Gestational Diabetes Mellitus) La diabetes mellitus gestacional, ms comnmente conocida como diabetes gestacional es un tipo de diabetes que desarrollan algunas mujeres durante el embarazo. En la diabetes gestacional, el pncreas no produce suficiente insulina (una hormona) o las clulas son menos sensibles a la insulina producida (resistencia a la insulina), o ambas cosas. Normalmente, la insulina mueve los azcares de los alimentos a las clulas de los tejidos. Las clulas de los tejidos utilizan los azcares para obtener energa. La falta de insulina o la falta de una respuesta normal a la insulina hace que el exceso de azcar se acumule en la sangre en lugar de penetrar en las clulas de los tejidos. Como resultado, se producen niveles altos de azcar en la sangre (hiperglucemia). El efecto de los niveles altos de azcar (glucosa) puede causar muchos problemas.  FACTORES DE RIESGO Usted tiene mayor probabilidad de desarrollar diabetes gestacional si tiene antecedentes familiares de diabetes y tambin si tiene uno o ms de los siguientes factores de riesgo:  ndice de masa corporal superior a 30 (obesidad).  Embarazo previo con diabetes gestacional.  La edad avanzada en el momento del embarazo. Si se mantienen los niveles de glucosa en la sangre en un rango normal durante el embarazo, las mujeres pueden tener un embarazo saludable. Si los niveles de glucosa en la sangre no estn bien controlados, puede haber riesgos para usted, el feto o el recin nacido, o durante el trabajo de parto y el parto.  SNTOMAS  Si se presentan sntomas, stos son similares a los sntomas que normalmente experimentar durante el embarazo. Los sntomas de la diabetes gestacional son:   Aumento de la sed (polidipsia).  Aumento de la miccin (poliuria).  Orina con ms frecuencia durante la noche (nocturia).  Prdida de peso. La prdida de peso puede ser muy rpida.  Infecciones  frecuentes y recurrentes.  Cansancio (fatiga).  Debilidad.  Cambios en la visin, como visin borrosa.  Olor a fruta en el aliento.  Dolor abdominal. DIAGNSTICO La diabetes se diagnostica cuando hay aumento de los niveles de glucosa en la sangre. El nivel de glucosa en la sangre puede controlarse en uno o ms de los siguientes anlisis de sangre:  Medicin de glucosa en la sangre en ayunas. No se le permitir comer durante al menos 8 horas antes de que se tome una muestra de sangre.  Pruebas al azar de glucosa en la sangre. El nivel de glucosa en la sangre se controla en cualquier momento del da sin importar el momento en que haya comido.  Prueba de A1c (hemoglobina glucosilada) Una prueba de A1c proporciona informacin sobre el control de la glucosa en la sangre durante los ltimos 3 meses.  Prueba de tolerancia a la glucosa oral (PTGO). La glucosa en la sangre se mide despus de no haber comido (ayunas) durante una a tres horas y despus de beber una bebida que contenga glucosa. Dado que las hormonas que causan la resistencia a la insulina son ms altas alrededor de las semanas 24 a 28 de embarazo, generalmente se realiza una PTGO durante ese tiempo. Si tiene factores de riesgo de diabetes gestacional, su mdico puede hacerle estudios de deteccin antes de las 24semanas de embarazo. TRATAMIENTO   Usted tendr que tomar medicamentos para la diabetes o insulina diariamente para mantener los niveles de glucosa en la sangre en el rango deseado.  Usted tendr que combinar la dosis de insulina con la actividad fsica y la eleccin de alimentos saludables. El objetivo del   tratamiento es mantener el nivel de azcar en la sangre previo a comer (preprandial) y durante la noche entre 60 y 99mg/dl, durante todo el embarazo. El objetivo del tratamiento es mantener el nivel pico de azcar en la sangre despus de comer (glucosa posprandial) entre 100y 140mg/dl. INSTRUCCIONES PARA EL CUIDADO EN EL  HOGAR   Controle su nivel de hemoglobina A1c dos veces al ao.  Contrlese a diario el nivel de glucosa en la sangre segn las indicaciones de su mdico. Es comn realizar controles frecuentes de la glucosa en la sangre.  Supervise las cetonas en la orina cuando est enferma y segn las indicaciones de su mdico.  Tome el medicamento para la diabetes y adminstrese insulina segn las indicaciones de su mdico para mantener el nivel de glucosa en la sangre en el rango deseado.  Nunca se quede sin medicamento para la diabetes o sin insulina. Es necesario que la reciba todos los das.  Ajuste la insulina segn la ingesta de hidratos de carbono. Los hidratos de carbono pueden aumentar los niveles de glucosa en la sangre, pero deben incluirse en su dieta. Los hidratos de carbono aportan vitaminas, minerales y fibra que son una parte esencial de una dieta saludable. Los hidratos de carbono se encuentran en frutas, verduras, cereales integrales, productos lcteos, legumbres y alimentos que contienen azcares aadidos.  Consuma alimentos saludables. Alterne 3 comidas con 3 colaciones.  Aumente de peso saludablemente. El aumento del peso total vara de acuerdo con el ndice de masa corporal que tena antes del embarazo ([facility]).  Lleve una tarjeta de alerta mdica o use una pulsera o medalla de alerta mdica.  Lleve con usted una colacin de 15gramos de hidratos de carbono en todo momento para controlar los niveles bajos de glucosa en la sangre (hipoglucemia). Algunos ejemplos de colaciones de 15gramos de hidratos de carbono son los siguientes:  Tabletas de glucosa, 3 o 4.  Gel de glucosa, tubo de 15 gramos.  Pasas de uva, 2 cucharadas (24 g).  Caramelos de goma, 6.  Galletas de animales, 8.  Jugo de fruta, gaseosa comn, o leche descremada, 4 onzas (120 ml).  Pastillas de goma, 9.  Reconocer la hipoglucemia. Durante el embarazo la hipoglucemia se produce cuando hay niveles de glucosa en la  sangre de 60 mg/dl o menos. El riesgo de hipoglucemia aumenta durante el ayuno o cuando se saltea las comidas, durante o despus de realizar ejercicio intenso y mientras duerme. Los sntomas de hipoglucemia son:  Temblores o sacudidas.  Disminucin de la capacidad de concentracin.  Sudoracin.  Aumento de la frecuencia cardaca.  Dolor de cabeza.  Sequedad en la boca.  Hambre.  Irritabilidad.  Ansiedad.  Sueo agitado.  Alteracin del habla o de la coordinacin.  Confusin.  Tratar la hipoglucemia rpidamente. Si usted est alerta y puede tragar con seguridad, siga la regla de 15/15 que consiste en:  Tome entre 15 y 20gramos de glucosa de accin rpida o carbohidratos. Las opciones de accin rpida son un gel de glucosa, tabletas de glucosa, o 4 onzas (120 ml) de jugo de frutas, gaseosa comn, o leche baja en grasa.  Compruebe su nivel de glucosa en la sangre 15 minutos despus de tomar la glucosa.  Tome entre 15 y 20 gramos ms de glucosa si el nivel de glucosa en la sangre todava es de 70mg/dl o inferior.  Ingiera una comida o una colacin en el lapso de 1 hora una vez que los niveles de glucosa en la sangre vuelven   a la normalidad.  Est atento a la poliuria (miccin excesiva) y la polidipsia (sensacin de mucha sed), que son los primeros signos de la hiperglucemia. El reconocimiento temprano de la hiperglucemia permite un tratamiento oportuno. Trate la hiperglucemia segn le indic su mdico.  Haga actividad fsica por lo menos 30minutos al da o como lo indique su mdico. Se recomienda que 30 minutos despus de cada comida, realice diez minutos de actividad fsica para controlar los niveles de glucosa postprandial en la sangre.  Ajuste su dosis de insulina y la ingesta de alimentos, segn sea necesario, si inicia un nuevo ejercicio o deporte.  Siga su plan para los das de enfermedad cuando no pueda comer o beber como de costumbre.  Evite el tabaco y el  alcohol.  Concurra a todas las visitas de control como se lo haya indicado el mdico.  Siga el consejo del mdico respecto a los controles prenatales y posteriores al parto (postparto), las visitas, la planificacin de las comidas, el ejercicio, los medicamentos, las vitaminas, los anlisis de sangre, otras pruebas mdicas y actividades fsicas.  Realice diariamente el cuidado de la piel y de los pies. Examine su piel y los pies diariamente para ver si tiene cortes, moretones, enrojecimiento, problemas en las uas, sangrado, ampollas o llagas.  Cepllese los dientes y encas por lo menos dos veces al da y use hilo dental al menos una vez por da. Concurra regularmente a las visitas de control con el dentista.  Programe un examen de vista durante el primer trimestre de su embarazo o como lo indique su mdico.  Comparta su plan de control de diabetes en el trabajo o en la escuela.  Mantngase al da con las vacunas.  Aprenda a manejar el estrs.  Obtenga la mayor cantidad posible de informacin sobre la diabetes y solicite ayuda siempre que sea necesario.  Obtenga informacin sobre el amamantamiento y analice esta posibilidad.  Debe controlar el nivel de azcar en la sangre de 6a 12semanas despus del parto. Esto se hace con una prueba de tolerancia a la glucosa oral (PTGO). SOLICITE ATENCIN MDICA SI:   No puede comer alimentos o beber por ms de 6 horas.  Tuvo nuseas o ha vomitado durante ms de 6 horas.  Tiene un nivel de glucosa en la sangre de 200 mg/dl y cetonas en la orina.  Presenta algn cambio en el estado mental.  Desarrolla problemas de visin.  Sufre un dolor persistente de cabeza.  Siente dolor o molestias en la parte superior del abdomen.  Desarrolla una enfermedad grave adicional.  Tuvo diarrea durante ms de 6 horas.  Ha estado enfermo o ha tenido fiebre durante un par de das y no mejora. SOLICITE ATENCIN MDICA DE INMEDIATO SI:   Tiene dificultad  para respirar.  Ya no siente los movimientos del beb.  Est sangrando o tiene flujo vaginal.  Comienza a tener contracciones o trabajo de parto prematuro. ASEGRESE DE QUE:  Comprende estas instrucciones.  Controlar su afeccin.  Recibir ayuda de inmediato si no mejora o si empeora. Document Released: 07/03/2005 Document Revised: 02/07/2014 [employer] Patient Information 2015 [employer]. This information is not intended to replace advice given to you by your health care provider. Make sure you discuss any questions you have with your health care provider.  Lactancia materna (Breastfeeding) Decidir amamantar es una de las mejores elecciones que puede hacer por usted y su beb. El cambio hormonal durante el embarazo produce el desarrollo del tejido mamario y aumenta la cantidad   y el tamao de los conductos galactforos. Estas hormonas tambin permiten que las protenas, los azcares y las grasas de la sangre produzcan la leche materna en las glndulas productoras de leche. Las hormonas impiden que la leche materna sea liberada antes del nacimiento del beb, adems de impulsar el flujo de leche luego del nacimiento. Una vez que ha comenzado a amamantar, pensar en el beb, as como la succin o el llanto, pueden estimular la liberacin de leche de las glndulas productoras de leche.  LOS BENEFICIOS DE AMAMANTAR Para el beb  La primera leche (calostro) ayuda a mejorar el funcionamiento del sistema digestivo del beb.  La leche tiene anticuerpos que ayudan a prevenir las infecciones en el beb.  El beb tiene una menor incidencia de asma, alergias y del sndrome de muerte sbita del lactante.  Los nutrientes en la leche materna son mejores para el beb que la leche maternizada y estn preparados exclusivamente para cubrir las necesidades del beb.  La leche materna mejora el desarrollo cerebral del beb.  Es menos probable que el beb desarrolle otras enfermedades, como obesidad  infantil, asma o diabetes mellitus de tipo 2. Para usted   La lactancia materna favorece el desarrollo de un vnculo muy especial entre la madre y el beb.  Es conveniente. La leche materna siempre est disponible a la temperatura correcta y es econmica.  La lactancia materna ayuda a quemar caloras y a perder el peso ganado durante el embarazo.  Favorece la contraccin del tero al tamao que tena antes del embarazo de manera ms rpida y disminuye el sangrado (loquios) despus del parto.  La lactancia materna contribuye a reducir el riesgo de desarrollar diabetes mellitus de tipo 2, osteoporosis o cncer de mama o de ovario en el futuro. SIGNOS DE QUE EL BEB EST HAMBRIENTO Primeros signos de hambre  Aumenta su estado de alerta o actividad.  Se estira.  Mueve la cabeza de un lado a otro.  Mueve la cabeza y abre la boca cuando se le toca la mejilla o la comisura de la boca (reflejo de bsqueda).  Aumenta las vocalizaciones, tales como sonidos de succin, se relame los labios, emite arrullos, suspiros, o chirridos.  Mueve la mano hacia la boca.  Se chupa con ganas los dedos o las manos. Signos tardos de hambre  Est agitado.  Llora de manera intermitente. Signos de hambre extrema Los signos de hambre extrema requerirn que lo calme y lo consuele antes de que el beb pueda alimentarse adecuadamente. No espere a que se manifiesten los siguientes signos de hambre extrema para comenzar a amamantar:   Agitacin.  Llanto intenso y fuerte.   Gritos. INFORMACIN BSICA SOBRE LA LACTANCIA MATERNA Iniciacin de la lactancia materna  Encuentre un lugar cmodo para sentarse o acostarse, con un buen respaldo para el cuello y la espalda.  Coloque una almohada o una manta enrollada debajo del beb para acomodarlo a la altura de la mama (si est sentada). Las almohadas para amamantar se han diseado especialmente a fin de servir de apoyo para los brazos y el beb mientras  amamanta.  Asegrese de que el abdomen del beb est frente al suyo.  Masajee suavemente la mama. Con las yemas de los dedos, masajee la pared del pecho hacia el pezn en un movimiento circular. Esto estimula el flujo de leche. Es posible que deba continuar este movimiento mientras amamanta si la leche fluye lentamente.  Sostenga la mama con el pulgar por arriba del pezn y los otros   4 dedos por debajo de la mama. Asegrese de que los dedos se encuentren lejos del pezn y de la boca del beb.  Empuje suavemente los labios del beb con el pezn o con el dedo.  Cuando la boca del beb se abra lo suficiente, acrquelo rpidamente a la mama e introduzca todo el pezn y la zona oscura que lo rodea (areola), tanto como sea posible, dentro de la boca del beb.  Debe haber ms areola visible por arriba del labio superior del beb que por debajo del labio inferior.  La lengua del beb debe estar entre la enca inferior y la mama.  Asegrese de que la boca del beb est en la posicin correcta alrededor del pezn (prendida). Los labios del beb deben crear un sello sobre la mama y estar doblados hacia afuera (invertidos).  Es comn que el beb succione durante 2 a 3 minutos para que comience el flujo de leche materna. Cmo debe prenderse Es muy importante que le ensee al beb cmo prenderse adecuadamente a la mama. Si el beb no se prende adecuadamente, puede causarle dolor en el pezn y reducir la produccin de leche materna, y hacer que el beb tenga un escaso aumento de peso. Adems, si el beb no se prende adecuadamente al pezn, puede tragar aire durante la alimentacin. Esto puede causarle molestias al beb. Hacer eructar al beb al cambiar de mama puede ayudarlo a liberar el aire. Sin embargo, ensearle al beb cmo prenderse a la mama adecuadamente es la mejor manera de evitar que se sienta molesto por tragar aire mientras se alimenta. Signos de que el beb se ha prendido adecuadamente al pezn:    Tironea o succiona de modo silencioso, sin causarle dolor.  Se escucha que traga cada 3 o 4 succiones.   Hay movimientos musculares por arriba y por delante de sus odos al succionar. Signos de que el beb no se ha prendido adecuadamente al pezn:   Hace ruidos de succin o de chasquido mientras se alimenta.  Siente dolor en el pezn. Si cree que el beb no se prendi correctamente, deslice el dedo en la comisura de la boca y colquelo entre las encas del beb para interrumpir la succin. Intente comenzar a amamantar nuevamente. Signos de lactancia materna exitosa Signos del beb:   Disminuye gradualmente el nmero de succiones o cesa la succin por completo.  Se duerme.  Relaja el cuerpo.  Retiene una pequea cantidad de leche en la boca.  Se desprende solo del pecho. Signos que presenta usted:  Las mamas han aumentado la firmeza, el peso y el tamao 1 a 3 horas despus de amamantar.  Estn ms blandas inmediatamente despus de amamantar.  Un aumento del volumen de leche, y tambin un cambio en su consistencia y color se producen hacia el quinto da de lactancia materna.  Los pezones no duelen, ni estn agrietados ni sangran. Signos de que su beb recibe la cantidad de leche suficiente  Moja al menos 3 paales en 24 horas. La orina debe ser clara y de color amarillo plido a los 5 das de vida.  Defeca al menos 3 veces en 24 horas a los 5 das de vida. La materia fecal debe ser blanda y amarillenta.  Defeca al menos 3 veces en 24 horas a los 7 das de vida. La materia fecal debe ser grumosa y amarillenta.  No registra una prdida de peso mayor del 10% del peso al nacer durante los primeros 3 das de vida.    Aumenta de peso un promedio de 4 a 7onzas (113 a 198g) por semana despus de los 4 das de vida.  Aumenta de peso, diariamente, de manera uniforme a partir de los 5 das de vida, sin registrar prdida de peso despus de las 2semanas de vida. Despus de  alimentarse, es posible que el beb regurgite una pequea cantidad. Esto es frecuente. FRECUENCIA Y DURACIN DE LA LACTANCIA MATERNA El amamantamiento frecuente la ayudar a producir ms leche y a prevenir problemas de dolor en los pezones e hinchazn en las mamas. Alimente al beb cuando muestre signos de hambre o si siente la necesidad de reducir la congestin de las mamas. Esto se denomina "lactancia a demanda". Evite el uso del chupete mientras trabaja para establecer la lactancia (las primeras 4 a 6 semanas despus del nacimiento del beb). Despus de este perodo, podr ofrecerle un chupete. Las investigaciones demostraron que el uso del chupete durante el primer ao de vida del beb disminuye el riesgo de desarrollar el sndrome de muerte sbita del lactante (SMSL). Permita que el nio se alimente en cada mama todo lo que desee. Contine amamantando al beb hasta que haya terminado de alimentarse. Cuando el beb se desprende o se queda dormido mientras se est alimentando de la primera mama, ofrzcale la segunda. Debido a que, con frecuencia, los recin nacidos permanecen somnolientos las primeras semanas de vida, es posible que deba despertar al beb para alimentarlo. Los horarios de lactancia varan de un beb a otro. Sin embargo, las siguientes reglas pueden servir como gua para ayudarla a garantizar que el beb se alimenta adecuadamente:  Se puede amamantar a los recin nacidos (bebs de 4 semanas o menos de vida) cada 1 a 3 horas.  No deben transcurrir ms de 3 horas durante el da o 5 horas durante la noche sin que se amamante a los recin nacidos.  Debe amamantar al beb 8 veces como mnimo en un perodo de 24 horas, hasta que comience a introducir slidos en su dieta, a los 6 meses de vida aproximadamente. EXTRACCIN DE LECHE MATERNA La extraccin y el almacenamiento de la leche materna le permiten asegurarse de que el beb se alimente exclusivamente de leche materna, aun en momentos en  los que no puede amamantar. Esto tiene especial importancia si debe regresar al trabajo en el perodo en que an est amamantando o si no puede estar presente en los momentos en que el beb debe alimentarse. Su asesor en lactancia puede orientarla sobre cunto tiempo es seguro almacenar leche materna.  El sacaleche es un aparato que le permite extraer leche de la mama a un recipiente estril. Luego, la leche materna extrada puede almacenarse en un refrigerador o congelador. Algunos sacaleches son manuales, mientras que otros son elctricos. Consulte a su asesor en lactancia qu tipo ser ms conveniente para usted. Los sacaleches se pueden comprar; sin embargo, algunos hospitales y grupos de apoyo a la lactancia materna alquilan sacaleches mensualmente. Un asesor en lactancia puede ensearle cmo extraer leche materna manualmente, en caso de que prefiera no usar un sacaleche.  CMO CUIDAR LAS MAMAS DURANTE LA LACTANCIA MATERNA Los pezones se secan, agrietan y duelen durante la lactancia materna. Las siguientes recomendaciones pueden ayudarla a mantener las mamas humectadas y sanas:  Evite usar jabn en los pezones.  Use un sostn de soporte. Aunque no son esenciales, las camisetas sin mangas o los sostenes especiales para amamantar estn diseados para acceder fcilmente a las mamas, para amamantar sin tener que quitarse todo   el sostn o la camiseta. Evite usar sostenes con aro o sostenes muy ajustados.  Seque al aire sus pezones durante 3 a 4minutos despus de amamantar al beb.  Utilice solo apsitos de algodn en el sostn para absorber las prdidas de leche. La prdida de un poco de leche materna entre las tomas es normal.  Utilice lanolina sobre los pezones luego de amamantar. La lanolina ayuda a mantener la humedad normal de la piel. Si usa lanolina pura, no tiene que lavarse los pezones antes de volver a alimentar al beb. La lanolina pura no es txica para el beb. Adems, puede extraer  manualmente algunas gotas de leche materna y masajear suavemente esa leche sobre los pezones, para que la leche se seque al aire. Durante las primeras semanas despus de dar a luz, algunas mujeres pueden experimentar hinchazn en las mamas (congestin mamaria). La congestin puede hacer que sienta las mamas pesadas, calientes y sensibles al tacto. El pico de la congestin ocurre dentro de los 3 a 5 das despus del parto. Las siguientes recomendaciones pueden ayudarla a aliviar la congestin:  Vace por completo las mamas al amamantar o extraer leche. Puede aplicar calor hmedo en las mamas (en la ducha o con toallas hmedas para manos) antes de amamantar o extraer leche. Esto aumenta la circulacin y ayuda a que la leche fluya. Si el beb no vaca por completo las mamas cuando lo amamanta, extraiga la leche restante despus de que haya finalizado.  Use un sostn ajustado (para amamantar o comn) o una camiseta sin mangas durante 1 o 2 das para indicar al cuerpo que disminuya ligeramente la produccin de leche.  Aplique compresas de hielo sobre las mamas, a menos que le resulte demasiado incmodo.  Asegrese de que el beb est prendido y se encuentre en la posicin correcta mientras lo alimenta. Si la congestin persiste luego de 48 horas o despus de seguir estas recomendaciones, comunquese con su mdico o un asesor en lactancia. RECOMENDACIONES GENERALES PARA EL CUIDADO DE LA SALUD DURANTE LA LACTANCIA MATERNA  Consuma alimentos saludables. Alterne comidas y colaciones, y coma 3 de cada una por da. Dado que lo que come afecta la leche materna, es posible que algunas comidas hagan que su beb se vuelva ms irritable de lo habitual. Evite comer este tipo de alimentos si percibe que afectan de manera negativa al beb.  Beba leche, jugos de fruta y agua para satisfacer su sed (aproximadamente 10 vasos al da).  Descanse con frecuencia, reljese y tome sus vitaminas prenatales para evitar la  fatiga, el estrs y la anemia.  Contine con los autocontroles de la mama.  Evite masticar y fumar tabaco.  Evite el consumo de alcohol y drogas. Algunos medicamentos, que pueden ser perjudiciales para el beb, pueden pasar a travs de la leche materna. Es importante que consulte a su mdico antes de tomar cualquier medicamento, incluidos todos los medicamentos recetados y de venta libre, as como los suplementos vitamnicos y herbales. Puede quedar embarazada durante la lactancia. Si desea controlar la natalidad, consulte a su mdico cules son las opciones ms seguras para el beb. SOLICITE ATENCIN MDICA SI:   Usted siente que quiere dejar de amamantar o se siente frustrada con la lactancia.  Siente dolor en las mamas o en los pezones.  Sus pezones estn agrietados o sangran.  Sus pechos estn irritados, sensibles o calientes.  Tiene un rea hinchada en cualquiera de las mamas.  Siente escalofros o fiebre.  Tiene nuseas o vmitos.    Presenta una secrecin de otro lquido distinto de la leche materna de los pezones.  Sus mamas no se llenan antes de amamantar al beb para el quinto da despus del parto.  Se siente triste y deprimida.  El beb est demasiado somnoliento como para comer bien.  El beb tiene problemas para dormir.  Moja menos de 3 paales en 24 horas.  Defeca menos de 3 veces en 24 horas.  La piel del beb o la parte blanca de los ojos se vuelven amarillentas.  El beb no ha aumentado de peso a los 5 das de vida. SOLICITE ATENCIN MDICA DE INMEDIATO SI:   El beb est muy cansado (letargo) y no se quiere despertar para comer.  Le sube la fiebre sin causa. Document Released: 09/23/2005 Document Revised: 09/28/2013 ExitCare Patient Information 2015 [employer]. This information is not intended to replace advice given to you by your health care provider. Make sure you discuss any questions you have with your health care provider.  

## 2014-12-19 NOTE — Progress Notes (Signed)
Patient has not begun taking baby aspirin-- advised she buy it OTC.

## 2015-01-02 ENCOUNTER — Encounter: Payer: Self-pay | Admitting: *Deleted

## 2015-01-09 ENCOUNTER — Encounter (HOSPITAL_COMMUNITY): Payer: Self-pay

## 2015-01-09 ENCOUNTER — Other Ambulatory Visit (HOSPITAL_COMMUNITY): Payer: Self-pay | Admitting: Maternal and Fetal Medicine

## 2015-01-09 ENCOUNTER — Ambulatory Visit (INDEPENDENT_AMBULATORY_CARE_PROVIDER_SITE_OTHER): Payer: Self-pay | Admitting: Family Medicine

## 2015-01-09 ENCOUNTER — Encounter (HOSPITAL_COMMUNITY): Payer: Self-pay | Admitting: Family Medicine

## 2015-01-09 ENCOUNTER — Ambulatory Visit (HOSPITAL_COMMUNITY)
Admission: RE | Admit: 2015-01-09 | Discharge: 2015-01-09 | Disposition: A | Discharge status: Home / Self Care | Payer: Medicaid Other | Source: Ambulatory Visit | Attending: Obstetrics & Gynecology | Admitting: Obstetrics & Gynecology

## 2015-01-09 VITALS — BP 124/71 | HR 103 | Temp 98.9°F | Wt 243.7 lb

## 2015-01-09 DIAGNOSIS — O09522 Supervision of elderly multigravida, second trimester: Secondary | ICD-10-CM

## 2015-01-09 DIAGNOSIS — Z98891 History of uterine scar from previous surgery: Secondary | ICD-10-CM

## 2015-01-09 DIAGNOSIS — O99212 Obesity complicating pregnancy, second trimester: Secondary | ICD-10-CM

## 2015-01-09 DIAGNOSIS — O24112 Pre-existing diabetes mellitus, type 2, in pregnancy, second trimester: Secondary | ICD-10-CM

## 2015-01-09 DIAGNOSIS — Z3A29 29 weeks gestation of pregnancy: Secondary | ICD-10-CM

## 2015-01-09 DIAGNOSIS — Z3A21 21 weeks gestation of pregnancy: Secondary | ICD-10-CM

## 2015-01-09 DIAGNOSIS — O352XX9 Maternal care for (suspected) hereditary disease in fetus, other fetus: Secondary | ICD-10-CM

## 2015-01-09 DIAGNOSIS — O09299 Supervision of pregnancy with other poor reproductive or obstetric history, unspecified trimester: Secondary | ICD-10-CM

## 2015-01-09 DIAGNOSIS — O24312 Unspecified pre-existing diabetes mellitus in pregnancy, second trimester: Secondary | ICD-10-CM

## 2015-01-09 DIAGNOSIS — O352XX0 Maternal care for (suspected) hereditary disease in fetus, not applicable or unspecified: Secondary | ICD-10-CM | POA: Insufficient documentation

## 2015-01-09 DIAGNOSIS — O09529 Supervision of elderly multigravida, unspecified trimester: Secondary | ICD-10-CM | POA: Insufficient documentation

## 2015-01-09 DIAGNOSIS — O0992 Supervision of high risk pregnancy, unspecified, second trimester: Secondary | ICD-10-CM

## 2015-01-09 DIAGNOSIS — O99210 Obesity complicating pregnancy, unspecified trimester: Secondary | ICD-10-CM | POA: Insufficient documentation

## 2015-01-09 LAB — POCT URINALYSIS DIP (DEVICE)
BILIRUBIN URINE: NEGATIVE
GLUCOSE, UA: NEGATIVE mg/dL
Hgb urine dipstick: NEGATIVE
Ketones, ur: NEGATIVE mg/dL
NITRITE: NEGATIVE
Protein, ur: NEGATIVE mg/dL
Specific Gravity, Urine: 1.015 (ref 1.005–1.030)
UROBILINOGEN UA: 0.2 mg/dL (ref 0.0–1.0)
pH: 7.5 (ref 5.0–8.0)

## 2015-01-09 MED ORDER — GLYBURIDE 5 MG PO TABS: 5.0000 mg | ORAL_TABLET | Freq: Two times a day (BID) | ORAL | Status: DC

## 2015-01-09 NOTE — Progress Notes (Signed)
Small leukocytes in urine.  

## 2015-01-09 NOTE — Progress Notes (Signed)
Spanish interpreter: Jeanette RegalCarol used FBS [phone] almost all out of range 2 hour pp 79-170 almost half out of range Increase glyburide to 5 mg bid

## 2015-01-09 NOTE — Patient Instructions (Signed)
Gestational Diabetes Mellitus Gestational diabetes mellitus, often simply referred to as gestational diabetes, is a type of diabetes that some women develop during pregnancy. In gestational diabetes, the pancreas does not make enough insulin (a hormone), the cells are less responsive to the insulin that is made (insulin resistance), or both.Normally, insulin moves sugars from food into the tissue cells. The tissue cells use the sugars for energy. The lack of insulin or the lack of normal response to insulin causes excess sugars to build up in the blood instead of going into the tissue cells. As a result, high blood sugar (hyperglycemia) develops. The effect of high sugar (glucose) levels can cause many problems.  RISK FACTORS You have an increased chance of developing gestational diabetes if you have a family history of diabetes and also have one or more of the following risk factors:  A body mass index over 30 (obesity).  A previous pregnancy with gestational diabetes.  An older age at the time of pregnancy. If blood glucose levels are kept in the normal range during pregnancy, women can have a healthy pregnancy. If your blood glucose levels are not well controlled, there may be risks to you, your unborn baby (fetus), your labor and delivery, or your newborn baby.  SYMPTOMS  If symptoms are experienced, they are much like symptoms you would normally expect during pregnancy. The symptoms of gestational diabetes include:   Increased thirst (polydipsia).  Increased urination (polyuria).  Increased urination during the night (nocturia).  Weight loss. This weight loss may be rapid.  Frequent, recurring infections.  Tiredness (fatigue).  Weakness.  Vision changes, such as blurred vision.  Fruity smell to your breath.  Abdominal pain. DIAGNOSIS Diabetes is diagnosed when blood glucose levels are increased. Your blood glucose level may be checked by one or more of the following blood  tests:  A fasting blood glucose test. You will not be allowed to eat for at least 8 hours before a blood sample is taken.  A random blood glucose test. Your blood glucose is checked at any time of the day regardless of when you ate.  A hemoglobin A1c blood glucose test. A hemoglobin A1c test provides information about blood glucose control over the previous 3 months.  An oral glucose tolerance test (OGTT). Your blood glucose is measured after you have not eaten (fasted) for 1-3 hours and then after you drink a glucose-containing beverage. Since the hormones that cause insulin resistance are highest at about 24-28 weeks of a pregnancy, an OGTT is usually performed during that time. If you have risk factors for gestational diabetes, your health care provider may test you for gestational diabetes earlier than 24 weeks of pregnancy. TREATMENT   You will need to take diabetes medicine or insulin daily to keep blood glucose levels in the desired range.  You will need to match insulin dosing with exercise and healthy food choices. The treatment goal is to maintain the before-meal (preprandial), bedtime, and overnight blood glucose level at 60-99 mg/dL during pregnancy. The treatment goal is to further maintain peak after-meal blood sugar (postprandial glucose) level at 100-140 mg/dL. HOME CARE INSTRUCTIONS   Have your hemoglobin A1c level checked twice a year.  Perform daily blood glucose monitoring as directed by your health care provider. It is common to perform frequent blood glucose monitoring.  Monitor urine ketones when you are ill and as directed by your health care provider.  Take your diabetes medicine and insulin as directed by your health care provider   to maintain your blood glucose level in the desired range.  Never run out of diabetes medicine or insulin. It is needed every day.  Adjust insulin based on your intake of carbohydrates. Carbohydrates can raise blood glucose levels but  need to be included in your diet. Carbohydrates provide vitamins, minerals, and fiber which are an essential part of a healthy diet. Carbohydrates are found in fruits, vegetables, whole grains, dairy products, legumes, and foods containing added sugars.  Eat healthy foods. Alternate 3 meals with 3 snacks.  Maintain a healthy weight gain. The usual total expected weight gain varies according to your prepregnancy body mass index (BMI).  Carry a medical alert card or wear your medical alert jewelry.  Carry a 15-gram carbohydrate snack with you at all times to treat low blood glucose (hypoglycemia). Some examples of 15-gram carbohydrate snacks include:  Glucose tablets, 3 or 4.  Glucose gel, 15-gram tube.  Raisins, 2 tablespoons (24 g).  Jelly beans, 6.  Animal crackers, 8.  Fruit juice, regular soda, or low-fat milk, 4 ounces (120 mL).  Gummy treats, 9.  Recognize hypoglycemia. Hypoglycemia during pregnancy occurs with blood glucose levels of 60 mg/dL and below. The risk for hypoglycemia increases when fasting or skipping meals, during or after intense exercise, and during sleep. Hypoglycemia symptoms can include:  Tremors or shakes.  Decreased ability to concentrate.  Sweating.  Increased heart rate.  Headache.  Dry mouth.  Hunger.  Irritability.  Anxiety.  Restless sleep.  Altered speech or coordination.  Confusion.  Treat hypoglycemia promptly. If you are alert and able to safely swallow, follow the 15:15 rule:  Take 15-20 grams of rapid-acting glucose or carbohydrate. Rapid-acting options include glucose gel, glucose tablets, or 4 ounces (120 mL) of fruit juice, regular soda, or low-fat milk.  Check your blood glucose level 15 minutes after taking the glucose.  Take 15-20 grams more of glucose if the repeat blood glucose level is still 70 mg/dL or below.  Eat a meal or snack within 1 hour once blood glucose levels return to normal.  Be alert to polyuria  (excess urination) and polydipsia (excess thirst) which are early signs of hyperglycemia. An early awareness of hyperglycemia allows for prompt treatment. Treat hyperglycemia as directed by your health care provider.  Engage in at least 30 minutes of physical activity a day or as directed by your health care provider. Ten minutes of physical activity timed 30 minutes after each meal is encouraged to control postprandial blood glucose levels.  Adjust your insulin dosing and food intake as needed if you start a new exercise or sport.  Follow your sick-day plan at any time you are unable to eat or drink as usual.  Avoid tobacco and alcohol use.  Keep all follow-up visits as directed by your health care provider.  Follow the advice of your health care provider regarding your prenatal and post-delivery (postpartum) appointments, meal planning, exercise, medicines, vitamins, blood tests, other medical tests, and physical activities.  Perform daily skin and foot care. Examine your skin and feet daily for cuts, bruises, redness, nail problems, bleeding, blisters, or sores.  Brush your teeth and gums at least twice a day and floss at least once a day. Follow up with your dentist regularly.  Schedule an eye exam during the first trimester of your pregnancy or as directed by your health care provider.  Share your diabetes management plan with your workplace or school.  Stay up-to-date with immunizations.  Learn to manage stress.    Obtain ongoing diabetes education and support as needed.  Learn about and consider breastfeeding your baby.  You should have your blood sugar level checked 6-12 weeks after delivery. This is done with an oral glucose tolerance test (OGTT). SEEK MEDICAL CARE IF:   You are unable to eat food or drink fluids for more than 6 hours.  You have nausea and vomiting for more than 6 hours.  You have a blood glucose level of 200 mg/dL and you have ketones in your  urine.  There is a change in mental status.  You develop vision problems.  You have a persistent headache.  You have upper abdominal pain or discomfort.  You develop an additional serious illness.  You have diarrhea for more than 6 hours.  You have been sick or have had a fever for a couple of days and are not getting better. SEEK IMMEDIATE MEDICAL CARE IF:   You have difficulty breathing.  You no longer feel the baby moving.  You are bleeding or have discharge from your vagina.  You start having premature contractions or labor. MAKE SURE YOU:  Understand these instructions.  Will watch your condition.  Will get help right away if you are not doing well or get worse. Document Released: 12/30/2000 Document Revised: 02/07/2014 Document Reviewed: 04/21/2012 ExitCare Patient Information 2015 [employer]. This information is not intended to replace advice given to you by your health care provider. Make sure you discuss any questions you have with your health care provider.  Breastfeeding Deciding to breastfeed is one of the best choices you can make for you and your baby. A change in hormones during pregnancy causes your breast tissue to grow and increases the number and size of your milk ducts. These hormones also allow proteins, sugars, and fats from your blood supply to make breast milk in your milk-producing glands. Hormones prevent breast milk from being released before your baby is born as well as prompt milk flow after birth. Once breastfeeding has begun, thoughts of your baby, as well as his or her sucking or crying, can stimulate the release of milk from your milk-producing glands.  BENEFITS OF BREASTFEEDING For Your Baby  Your first milk (colostrum) helps your baby's digestive system function better.   There are antibodies in your milk that help your baby fight off infections.   Your baby has a lower incidence of asthma, allergies, and sudden infant death  syndrome.   The nutrients in breast milk are better for your baby than infant formulas and are designed uniquely for your baby's needs.   Breast milk improves your baby's brain development.   Your baby is less likely to develop other conditions, such as childhood obesity, asthma, or type 2 diabetes mellitus.  For You   Breastfeeding helps to create a very special bond between you and your baby.   Breastfeeding is convenient. Breast milk is always available at the correct temperature and costs nothing.   Breastfeeding helps to burn calories and helps you lose the weight gained during pregnancy.   Breastfeeding makes your uterus contract to its prepregnancy size faster and slows bleeding (lochia) after you give birth.   Breastfeeding helps to lower your risk of developing type 2 diabetes mellitus, osteoporosis, and breast or ovarian cancer later in life. SIGNS THAT YOUR BABY IS HUNGRY Early Signs of Hunger  Increased alertness or activity.  Stretching.  Movement of the head from side to side.  Movement of the head and opening of the   mouth when the corner of the mouth or cheek is stroked (rooting).  Increased sucking sounds, smacking lips, cooing, sighing, or squeaking.  Hand-to-mouth movements.  Increased sucking of fingers or hands. Late Signs of Hunger  Fussing.  Intermittent crying. Extreme Signs of Hunger Signs of extreme hunger will require calming and consoling before your baby will be able to breastfeed successfully. Do not wait for the following signs of extreme hunger to occur before you initiate breastfeeding:   Restlessness.  A loud, strong cry.   Screaming. BREASTFEEDING BASICS Breastfeeding Initiation  Find a comfortable place to sit or lie down, with your neck and back well supported.  Place a pillow or rolled up blanket under your baby to bring him or her to the level of your breast (if you are seated). Nursing pillows are specially designed  to help support your arms and your baby while you breastfeed.  Make sure that your baby's abdomen is facing your abdomen.   Gently massage your breast. With your fingertips, massage from your chest wall toward your nipple in a circular motion. This encourages milk flow. You may need to continue this action during the feeding if your milk flows slowly.  Support your breast with 4 fingers underneath and your thumb above your nipple. Make sure your fingers are well away from your nipple and your baby's mouth.   Stroke your baby's lips gently with your finger or nipple.   When your baby's mouth is open wide enough, quickly bring your baby to your breast, placing your entire nipple and as much of the colored area around your nipple (areola) as possible into your baby's mouth.   More areola should be visible above your baby's upper lip than below the lower lip.   Your baby's tongue should be between his or her lower gum and your breast.   Ensure that your baby's mouth is correctly positioned around your nipple (latched). Your baby's lips should create a seal on your breast and be turned out (everted).  It is common for your baby to suck about 2-3 minutes in order to start the flow of breast milk. Latching Teaching your baby how to latch on to your breast properly is very important. An improper latch can cause nipple pain and decreased milk supply for you and poor weight gain in your baby. Also, if your baby is not latched onto your nipple properly, he or she may swallow some air during feeding. This can make your baby fussy. Burping your baby when you switch breasts during the feeding can help to get rid of the air. However, teaching your baby to latch on properly is still the best way to prevent fussiness from swallowing air while breastfeeding. Signs that your baby has successfully latched on to your nipple:    Silent tugging or silent sucking, without causing you pain.   Swallowing  heard between every 3-4 sucks.    Muscle movement above and in front of his or her ears while sucking.  Signs that your baby has not successfully latched on to nipple:   Sucking sounds or smacking sounds from your baby while breastfeeding.  Nipple pain. If you think your baby has not latched on correctly, slip your finger into the corner of your baby's mouth to break the suction and place it between your baby's gums. Attempt breastfeeding initiation again. Signs of Successful Breastfeeding Signs from your baby:   A gradual decrease in the number of sucks or complete cessation of sucking.     Falling asleep.   Relaxation of his or her body.   Retention of a small amount of milk in his or her mouth.   Letting go of your breast by himself or herself. Signs from you:  Breasts that have increased in firmness, weight, and size 1-3 hours after feeding.   Breasts that are softer immediately after breastfeeding.  Increased milk volume, as well as a change in milk consistency and color by the fifth day of breastfeeding.   Nipples that are not sore, cracked, or bleeding. Signs That Your Baby is Getting Enough Milk  Wetting at least 3 diapers in a 24-hour period. The urine should be clear and pale yellow by age 5 days.  At least 3 stools in a 24-hour period by age 5 days. The stool should be soft and yellow.  At least 3 stools in a 24-hour period by age 7 days. The stool should be seedy and yellow.  No loss of weight greater than 10% of birth weight during the first 3 days of age.  Average weight gain of 4-7 ounces (113-198 g) per week after age 4 days.  Consistent daily weight gain by age 5 days, without weight loss after the age of 2 weeks. After a feeding, your baby may spit up a small amount. This is common. BREASTFEEDING FREQUENCY AND DURATION Frequent feeding will help you make more milk and can prevent sore nipples and breast engorgement. Breastfeed when you feel the  need to reduce the fullness of your breasts or when your baby shows signs of hunger. This is called "breastfeeding on demand." Avoid introducing a pacifier to your baby while you are working to establish breastfeeding (the first 4-6 weeks after your baby is born). After this time you may choose to use a pacifier. Research has shown that pacifier use during the first year of a baby's life decreases the risk of sudden infant death syndrome (SIDS). Allow your baby to feed on each breast as long as he or she wants. Breastfeed until your baby is finished feeding. When your baby unlatches or falls asleep while feeding from the first breast, offer the second breast. Because newborns are often sleepy in the first few weeks of life, you may need to awaken your baby to get him or her to feed. Breastfeeding times will vary from baby to baby. However, the following rules can serve as a guide to help you ensure that your baby is properly fed:  Newborns (babies 4 weeks of age or younger) may breastfeed every 1-3 hours.  Newborns should not go longer than 3 hours during the day or 5 hours during the night without breastfeeding.  You should breastfeed your baby a minimum of 8 times in a 24-hour period until you begin to introduce solid foods to your baby at around 6 months of age. BREAST MILK PUMPING Pumping and storing breast milk allows you to ensure that your baby is exclusively fed your breast milk, even at times when you are unable to breastfeed. This is especially important if you are going back to work while you are still breastfeeding or when you are not able to be present during feedings. Your lactation consultant can give you guidelines on how long it is safe to store breast milk.  A breast pump is a machine that allows you to pump milk from your breast into a sterile bottle. The pumped breast milk can then be stored in a refrigerator or freezer. Some breast pumps are operated by   hand, while others use  electricity. Ask your lactation consultant which type will work best for you. Breast pumps can be purchased, but some hospitals and breastfeeding support groups lease breast pumps on a monthly basis. A lactation consultant can teach you how to hand express breast milk, if you prefer not to use a pump.  CARING FOR YOUR BREASTS WHILE YOU BREASTFEED Nipples can become dry, cracked, and sore while breastfeeding. The following recommendations can help keep your breasts moisturized and healthy:  Avoid using soap on your nipples.   Wear a supportive bra. Although not required, special nursing bras and tank tops are designed to allow access to your breasts for breastfeeding without taking off your entire bra or top. Avoid wearing underwire-style bras or extremely tight bras.  Air dry your nipples for 3-4minutes after each feeding.   Use only cotton bra pads to absorb leaked breast milk. Leaking of breast milk between feedings is normal.   Use lanolin on your nipples after breastfeeding. Lanolin helps to maintain your skin's normal moisture barrier. If you use pure lanolin, you do not need to wash it off before feeding your baby again. Pure lanolin is not toxic to your baby. You may also hand express a few drops of breast milk and gently massage that milk into your nipples and allow the milk to air dry. In the first few weeks after giving birth, some women experience extremely full breasts (engorgement). Engorgement can make your breasts feel heavy, warm, and tender to the touch. Engorgement peaks within 3-5 days after you give birth. The following recommendations can help ease engorgement:  Completely empty your breasts while breastfeeding or pumping. You may want to start by applying warm, moist heat (in the shower or with warm water-soaked hand towels) just before feeding or pumping. This increases circulation and helps the milk flow. If your baby does not completely empty your breasts while  breastfeeding, pump any extra milk after he or she is finished.  Wear a snug bra (nursing or regular) or tank top for 1-2 days to signal your body to slightly decrease milk production.  Apply ice packs to your breasts, unless this is too uncomfortable for you.  Make sure that your baby is latched on and positioned properly while breastfeeding. If engorgement persists after 48 hours of following these recommendations, contact your health care provider or a lactation consultant. OVERALL HEALTH CARE RECOMMENDATIONS WHILE BREASTFEEDING  Eat healthy foods. Alternate between meals and snacks, eating 3 of each per day. Because what you eat affects your breast milk, some of the foods may make your baby more irritable than usual. Avoid eating these foods if you are sure that they are negatively affecting your baby.  Drink milk, fruit juice, and water to satisfy your thirst (about 10 glasses a day).   Rest often, relax, and continue to take your prenatal vitamins to prevent fatigue, stress, and anemia.  Continue breast self-awareness checks.  Avoid chewing and smoking tobacco.  Avoid alcohol and drug use. Some medicines that may be harmful to your baby can pass through breast milk. It is important to ask your health care provider before taking any medicine, including all over-the-counter and prescription medicine as well as vitamin and herbal supplements. It is possible to become pregnant while breastfeeding. If birth control is desired, ask your health care provider about options that will be safe for your baby. SEEK MEDICAL CARE IF:   You feel like you want to stop breastfeeding or have become   frustrated with breastfeeding.  You have painful breasts or nipples.  Your nipples are cracked or bleeding.  Your breasts are red, tender, or warm.  You have a swollen area on either breast.  You have a fever or chills.  You have nausea or vomiting.  You have drainage other than breast milk from  your nipples.  Your breasts do not become full before feedings by the fifth day after you give birth.  You feel sad and depressed.  Your baby is too sleepy to eat well.  Your baby is having trouble sleeping.   Your baby is wetting less than 3 diapers in a 24-hour period.  Your baby has less than 3 stools in a 24-hour period.  Your baby's skin or the white part of his or her eyes becomes yellow.   Your baby is not gaining weight by 5 days of age. SEEK IMMEDIATE MEDICAL CARE IF:   Your baby is overly tired (lethargic) and does not want to wake up and feed.  Your baby develops an unexplained fever. Document Released: 09/23/2005 Document Revised: 09/28/2013 Document Reviewed: 03/17/2013 Exit[employer] Patient Information 2015 [employer]. This information is not intended to replace advice given to you by your health care provider. Make sure you discuss any questions you have with your health care provider.  

## 2015-01-09 NOTE — Progress Notes (Signed)
Used Interpreter Jeanette [name]. C/o pressure on RLQ.

## 2015-01-23 ENCOUNTER — Ambulatory Visit (INDEPENDENT_AMBULATORY_CARE_PROVIDER_SITE_OTHER): Payer: Self-pay | Admitting: Obstetrics and Gynecology

## 2015-01-23 VITALS — BP 112/66 | HR 96 | Temp 97.5°F | Wt 244.7 lb

## 2015-01-23 DIAGNOSIS — O34219 Maternal care for unspecified type scar from previous cesarean delivery: Secondary | ICD-10-CM

## 2015-01-23 DIAGNOSIS — O162 Unspecified maternal hypertension, second trimester: Secondary | ICD-10-CM

## 2015-01-23 DIAGNOSIS — O3421 Maternal care for scar from previous cesarean delivery: Secondary | ICD-10-CM

## 2015-01-23 DIAGNOSIS — O24112 Pre-existing diabetes mellitus, type 2, in pregnancy, second trimester: Secondary | ICD-10-CM

## 2015-01-23 DIAGNOSIS — O0992 Supervision of high risk pregnancy, unspecified, second trimester: Secondary | ICD-10-CM

## 2015-01-23 LAB — POCT URINALYSIS DIP (DEVICE)
Bilirubin Urine: NEGATIVE
GLUCOSE, UA: NEGATIVE mg/dL
Hgb urine dipstick: NEGATIVE
KETONES UR: NEGATIVE mg/dL
Nitrite: NEGATIVE
Protein, ur: NEGATIVE mg/dL
SPECIFIC GRAVITY, URINE: 1.01 (ref 1.005–1.030)
Urobilinogen, UA: 0.2 mg/dL (ref 0.0–1.0)
pH: 6.5 (ref 5.0–8.0)

## 2015-01-23 NOTE — Progress Notes (Signed)
Nutrition note: GDM diet review Pt has lost 7.3# @ 5593w5d. Pt has been checking her BS- fasting: 83-118, 2hr pp: 92-189 Pt reports eating 3 meals & 2-3 snacks/d. Pt stated that she'll have a yogurt & fruit as her evening snack; explained that this could be causing her elevated fasting BS & encouraged client to choose 1 CHO serving. Also suggested yogurts with less CHO: [employer] or [employer]. Pt reports walking for ~25 mins/d. Reviewed GDM & portion sizes with client via an interpreter.  Pt agrees to decrease portion sizes & note in her log what she ate when they are >120 2hr pp. F/u in 2-4 wks Blondell RevealLaura Diera Wirkkala, MS, RD, LDN, [facility] CenterBCLC

## 2015-01-23 NOTE — Progress Notes (Deleted)
Nutrition note: GDM diet review Pt has lost 7.3# @ 5575w5d

## 2015-01-23 NOTE — Progress Notes (Signed)
39 y.o. [id number] at 1035w5d here for routine OB visit. Spanish interpreter used for today's visit.  1. A2GDM, poorly controlled. Reviewed blood glucose log today and continues to be poorly controlled although improving. Fasting BG 83-118. PP BG 92-173. Continue glyburide 5 mg BID. Counseled on diet/exercise. See nutritionist today.   2. Chronic hypertension, no medications. Continue aspirin 81 mg daily.  3. Routine PNC. Labs reviewed, up to date. FM/PTL precautions given. RTC 2 weeks.

## 2015-01-23 NOTE — Progress Notes (Signed)
Ieesha Elena used for interpreter 

## 2015-02-06 ENCOUNTER — Ambulatory Visit (INDEPENDENT_AMBULATORY_CARE_PROVIDER_SITE_OTHER): Payer: Self-pay | Admitting: Obstetrics & Gynecology

## 2015-02-06 ENCOUNTER — Encounter (HOSPITAL_COMMUNITY): Payer: Self-pay

## 2015-02-06 ENCOUNTER — Ambulatory Visit (HOSPITAL_COMMUNITY)
Admission: RE | Admit: 2015-02-06 | Discharge: 2015-02-06 | Disposition: A | Discharge status: Home / Self Care | Payer: Self-pay | Source: Ambulatory Visit | Attending: Obstetrics & Gynecology | Admitting: Obstetrics & Gynecology

## 2015-02-06 VITALS — BP 117/73 | HR 101 | Temp 98.5°F | Wt 244.3 lb

## 2015-02-06 DIAGNOSIS — E119 Type 2 diabetes mellitus without complications: Secondary | ICD-10-CM | POA: Insufficient documentation

## 2015-02-06 DIAGNOSIS — Z98891 History of uterine scar from previous surgery: Secondary | ICD-10-CM

## 2015-02-06 DIAGNOSIS — O24112 Pre-existing diabetes mellitus, type 2, in pregnancy, second trimester: Secondary | ICD-10-CM

## 2015-02-06 DIAGNOSIS — O24414 Gestational diabetes mellitus in pregnancy, insulin controlled: Secondary | ICD-10-CM

## 2015-02-06 DIAGNOSIS — O24312 Unspecified pre-existing diabetes mellitus in pregnancy, second trimester: Secondary | ICD-10-CM

## 2015-02-06 DIAGNOSIS — O99212 Obesity complicating pregnancy, second trimester: Secondary | ICD-10-CM

## 2015-02-06 DIAGNOSIS — O3421 Maternal care for scar from previous cesarean delivery: Secondary | ICD-10-CM | POA: Insufficient documentation

## 2015-02-06 DIAGNOSIS — O352XX9 Maternal care for (suspected) hereditary disease in fetus, other fetus: Secondary | ICD-10-CM

## 2015-02-06 DIAGNOSIS — O09522 Supervision of elderly multigravida, second trimester: Secondary | ICD-10-CM

## 2015-02-06 DIAGNOSIS — Z3A29 29 weeks gestation of pregnancy: Secondary | ICD-10-CM

## 2015-02-06 DIAGNOSIS — Z36 Encounter for antenatal screening of mother: Secondary | ICD-10-CM | POA: Insufficient documentation

## 2015-02-06 DIAGNOSIS — O09299 Supervision of pregnancy with other poor reproductive or obstetric history, unspecified trimester: Secondary | ICD-10-CM

## 2015-02-06 DIAGNOSIS — Z3A25 25 weeks gestation of pregnancy: Secondary | ICD-10-CM | POA: Insufficient documentation

## 2015-02-06 LAB — POCT URINALYSIS DIP (DEVICE)
BILIRUBIN URINE: NEGATIVE
Glucose, UA: NEGATIVE mg/dL
HGB URINE DIPSTICK: NEGATIVE
KETONES UR: NEGATIVE mg/dL
NITRITE: NEGATIVE
PROTEIN: NEGATIVE mg/dL
SPECIFIC GRAVITY, URINE: 1.015 (ref 1.005–1.030)
UROBILINOGEN UA: 0.2 mg/dL (ref 0.0–1.0)
pH: 7 (ref 5.0–8.0)

## 2015-02-06 LAB — BASIC METABOLIC PANEL
BUN: 9 mg/dL (ref 6–23)
CALCIUM: 9.1 mg/dL (ref 8.4–10.5)
CO2: 24 meq/L (ref 19–32)
CREATININE: 0.49 mg/dL — AB (ref 0.50–1.10)
Chloride: 101 mEq/L (ref 96–112)
GLUCOSE: 100 mg/dL — AB (ref 70–99)
POTASSIUM: 4.2 meq/L (ref 3.5–5.3)
Sodium: 138 mEq/L (ref 135–145)

## 2015-02-06 MED ORDER — GLYBURIDE 5 MG PO TABS: 5.0000 mg | ORAL_TABLET | Freq: Two times a day (BID) | ORAL | Status: DC

## 2015-02-06 MED ORDER — METFORMIN HCL 500 MG PO TABS: 500.0000 mg | ORAL_TABLET | Freq: Two times a day (BID) | ORAL | Status: DC

## 2015-02-06 NOTE — Progress Notes (Signed)
Fastings 83-125; pp breakfast; pp breakfast 98-146; pp lunch 93-142; pp dinner 97-150 Will add metformin 500 mg bid.  Check creatinine today. Patient not eating enough protein in am.  Will send to nutritionist. Patient has US today.

## 2015-02-06 NOTE — Progress Notes (Signed)
[facility] notified pt will be late for U/S appt, [facility] Jeanette [name] [facility] and will be worked in for U/S.  Spanish interpreter [name] present.

## 2015-02-06 NOTE — ED Notes (Signed)
Spanish interpreter Dorias with pt.

## 2015-02-06 NOTE — Progress Notes (Signed)
Leukocytes: Trace 

## 2015-02-06 NOTE — Progress Notes (Signed)
Nutrition note: GDM diet f/u Pt has lost 7.7# @ 637w5d. Pt has been checking BS- fasting: 83-125, 2hr pp: 93-159 After discussing pt's intake, pt reports that sometimes she eats fruit before checking her BS in the morning (ie. eats breakfast @ 8:30am, eats fruit @ 10am and then checks BS @ 10:30am. Stressed importance of not eating any other CHO between her breakfast & checking her BS 2hrs after. Suggested pt could eat protein foods without CHO if she is hungry before it is time to check her BS.  Pt also stated that she makes gelatin desserts & eats cake and wanted to know if she could eat those - encouraged pt to cut out the gelatin desserts and only have small piece of cake every now & then and to include it in her CHO servings with a meal or snack. Pt states understanding. F/u in 2-4 wks Jeanette RevealLaura Shaye Lagace, MS, RD, LDN, [facility]

## 2015-02-06 NOTE — Progress Notes (Signed)
Jeanette [name] used for interpreter; patient has been having trouble receiving glyburide 5mg  due to issues with pharmacy; needs PNV refill Patient reports occasional pressure  Called patient's pharmacy who states they never received Rx for glyburide 5mg . Reordered med

## 2015-02-08 ENCOUNTER — Other Ambulatory Visit (HOSPITAL_COMMUNITY): Payer: Self-pay | Admitting: Obstetrics and Gynecology

## 2015-02-08 DIAGNOSIS — O99210 Obesity complicating pregnancy, unspecified trimester: Secondary | ICD-10-CM

## 2015-02-08 DIAGNOSIS — O09522 Supervision of elderly multigravida, second trimester: Secondary | ICD-10-CM

## 2015-02-08 DIAGNOSIS — O24313 Unspecified pre-existing diabetes mellitus in pregnancy, third trimester: Secondary | ICD-10-CM

## 2015-02-08 DIAGNOSIS — O352XX0 Maternal care for (suspected) hereditary disease in fetus, not applicable or unspecified: Secondary | ICD-10-CM

## 2015-02-08 DIAGNOSIS — O34219 Maternal care for unspecified type scar from previous cesarean delivery: Secondary | ICD-10-CM

## 2015-02-08 DIAGNOSIS — O09299 Supervision of pregnancy with other poor reproductive or obstetric history, unspecified trimester: Secondary | ICD-10-CM

## 2015-02-13 ENCOUNTER — Ambulatory Visit (INDEPENDENT_AMBULATORY_CARE_PROVIDER_SITE_OTHER): Payer: Self-pay | Admitting: Family Medicine

## 2015-02-13 VITALS — BP 122/81 | HR 94 | Temp 97.5°F | Wt 243.7 lb

## 2015-02-13 DIAGNOSIS — O09522 Supervision of elderly multigravida, second trimester: Secondary | ICD-10-CM

## 2015-02-13 DIAGNOSIS — Z23 Encounter for immunization: Secondary | ICD-10-CM

## 2015-02-13 DIAGNOSIS — O24112 Pre-existing diabetes mellitus, type 2, in pregnancy, second trimester: Secondary | ICD-10-CM

## 2015-02-13 DIAGNOSIS — O162 Unspecified maternal hypertension, second trimester: Secondary | ICD-10-CM

## 2015-02-13 DIAGNOSIS — O3421 Maternal care for scar from previous cesarean delivery: Secondary | ICD-10-CM

## 2015-02-13 DIAGNOSIS — O0992 Supervision of high risk pregnancy, unspecified, second trimester: Secondary | ICD-10-CM

## 2015-02-13 DIAGNOSIS — O34219 Maternal care for unspecified type scar from previous cesarean delivery: Secondary | ICD-10-CM

## 2015-02-13 LAB — CBC
HCT: 36.6 % (ref 36.0–46.0)
HEMOGLOBIN: 11.9 g/dL — AB (ref 12.0–15.0)
MCH: 24.8 pg — ABNORMAL LOW (ref 26.0–34.0)
MCHC: 32.5 g/dL (ref 30.0–36.0)
MCV: 76.4 fL — ABNORMAL LOW (ref 78.0–100.0)
MPV: 10.1 fL (ref 8.6–12.4)
Platelets: 414 10*3/uL — ABNORMAL HIGH (ref 150–400)
RBC: 4.79 MIL/uL (ref 3.87–5.11)
RDW: 16.8 % — ABNORMAL HIGH (ref 11.5–15.5)
WBC: 12.3 10*3/uL — ABNORMAL HIGH (ref 4.0–10.5)

## 2015-02-13 LAB — POCT URINALYSIS DIP (DEVICE)
Bilirubin Urine: NEGATIVE
GLUCOSE, UA: NEGATIVE mg/dL
HGB URINE DIPSTICK: NEGATIVE
Nitrite: NEGATIVE
Protein, ur: NEGATIVE mg/dL
SPECIFIC GRAVITY, URINE: 1.015 (ref 1.005–1.030)
Urobilinogen, UA: 0.2 mg/dL (ref 0.0–1.0)
pH: 6.5 (ref 5.0–8.0)

## 2015-02-13 MED ORDER — TETANUS-DIPHTH-ACELL PERTUSSIS 5-2.5-18.5 LF-MCG/0.5 IM SUSP
0.5000 mL | Freq: Once | INTRAMUSCULAR | Status: AC
  Administered 2015-02-13: 0.5 mL via INTRAMUSCULAR

## 2015-02-13 MED ORDER — GLYBURIDE 5 MG PO TABS: 5.0000 mg | ORAL_TABLET | Freq: Two times a day (BID) | ORAL | Status: DC

## 2015-02-13 MED ORDER — METFORMIN HCL 500 MG PO TABS: 500.0000 mg | ORAL_TABLET | Freq: Two times a day (BID) | ORAL | Status: DC

## 2015-02-13 NOTE — Patient Instructions (Signed)
Tercer trimestre de embarazo (Third Trimester of Pregnancy) El tercer trimestre va desde la semana29 hasta la 42, desde el sptimo hasta el noveno mes, y es la poca en la que el feto crece ms rpidamente. Hacia el final del noveno mes, el feto mide alrededor de 20pulgadas (45cm) de largo y pesa entre 6 y 10 libras (2,700 y 4,500kg).  CAMBIOS EN EL ORGANISMO Su organismo atraviesa por muchos cambios durante el embarazo, y estos varan de una mujer a otra.   Seguir aumentando de peso. Es de esperar que aumente entre 25 y 35libras (11 y 16kg) hacia el final del embarazo.  Podrn aparecer las primeras estras en las caderas, el abdomen y las mamas.  Puede tener necesidad de orinar con ms frecuencia porque el feto baja hacia la pelvis y ejerce presin sobre la vejiga.  Debido al embarazo podr sentir acidez estomacal con frecuencia.  Puede estar estreida, ya que ciertas hormonas enlentecen los movimientos de los msculos que empujan los desechos a travs de los intestinos.  Pueden aparecer hemorroides o abultarse e hincharse las venas (venas varicosas).  Puede sentir dolor plvico debido al aumento de peso y a que las hormonas del embarazo relajan las articulaciones entre los huesos de la pelvis. El dolor de espalda puede ser consecuencia de la sobrecarga de los msculos que soportan la postura.  Tal vez haya cambios en el cabello que pueden incluir su engrosamiento, crecimiento rpido y cambios en la textura. Adems, a algunas mujeres se les cae el cabello durante o despus del embarazo, o tienen el cabello seco o fino. Lo ms probable es que el cabello se le normalice despus del nacimiento del beb.  Las mamas seguirn creciendo y le dolern. A veces, puede haber una secrecin amarilla de las mamas llamada calostro.  El ombligo puede salir hacia afuera.  Puede sentir que le falta el aire debido a que se expande el tero.  Puede notar que el feto "baja" o lo siente ms bajo, en el  abdomen.  Puede tener una prdida de secrecin mucosa con sangre. Esto suele ocurrir en el trmino de unos pocos das a una semana antes de que comience el trabajo de parto.  El cuello del tero se vuelve delgado y blando (se borra) cerca de la fecha de parto. QU DEBE ESPERAR EN LOS EXMENES PRENATALES  Le harn exmenes prenatales cada 2semanas hasta la semana36. A partir de ese momento le harn exmenes semanales. Durante una visita prenatal de rutina:  La pesarn para asegurarse de que usted y el feto estn creciendo normalmente.  Le tomarn la presin arterial.  Le medirn el abdomen para controlar el desarrollo del beb.  Se escucharn los latidos cardacos fetales.  Se evaluarn los resultados de los estudios solicitados en visitas anteriores.  Le revisarn el cuello del tero cuando est prxima la fecha de parto para controlar si este se ha borrado. Alrededor de la semana36, el mdico le revisar el cuello del tero. Al mismo tiempo, realizar un anlisis de las secreciones del tejido vaginal. Este examen es para determinar si hay un tipo de bacteria, estreptococo Grupo B. El mdico le explicar esto con ms detalle. El mdico puede preguntarle lo siguiente:  Cmo le gustara que fuera el parto.  Cmo se siente.  Si siente los movimientos del beb.  Si ha tenido sntomas anormales, como prdida de lquido, sangrado, dolores de cabeza intensos o clicos abdominales.  Si tiene alguna pregunta. Otros exmenes o estudios de deteccin que pueden realizarse   durante el tercer trimestre incluyen lo siguiente:  Anlisis de sangre para controlar las concentraciones de hierro (anemia).  Controles fetales para determinar su salud, nivel de actividad y crecimiento. Si tiene alguna enfermedad o hay problemas durante el embarazo, le harn estudios. FALSO TRABAJO DE PARTO Es posible que sienta contracciones leves e irregulares que finalmente desaparecen. Se llaman contracciones de  Braxton Hicks o falso trabajo de parto. Las contracciones pueden durar horas, das o incluso semanas, antes de que el verdadero trabajo de parto se inicie. Si las contracciones ocurren a intervalos regulares, se intensifican o se hacen dolorosas, lo mejor es que la revise el mdico.  SIGNOS DE TRABAJO DE PARTO   Clicos de tipo menstrual.  Contracciones cada 5minutos o menos.  Contracciones que comienzan en la parte superior del tero y se extienden hacia abajo, a la zona inferior del abdomen y la espalda.  Sensacin de mayor presin en la pelvis o dolor de espalda.  Una secrecin de mucosidad acuosa o con sangre que sale de la vagina. Si tiene alguno de estos signos antes de la semana37 del embarazo, llame a su mdico de inmediato. Debe concurrir al hospital para que la controlen inmediatamente. INSTRUCCIONES PARA EL CUIDADO EN EL HOGAR   Evite fumar, consumir hierbas, beber alcohol y tomar frmacos que no le hayan recetado. Estas sustancias qumicas afectan la formacin y el desarrollo del beb.  Siga las indicaciones del mdico en relacin con el uso de medicamentos. Durante el embarazo, hay medicamentos que son seguros de tomar y otros que no.  Haga actividad fsica solo en la forma indicada por el mdico. Sentir clicos uterinos es un buen signo para detener la actividad fsica.  Contine comiendo alimentos que sanos con regularidad.  Use un sostn que le brinde buen soporte si le duelen las mamas.  No se d baos de inmersin en agua caliente, baos turcos ni saunas.  Colquese el cinturn de seguridad cuando conduzca.  No coma carne cruda ni queso sin cocinar; evite el contacto con las bandejas sanitarias de los gatos y la tierra que estos animales usan. Estos elementos contienen grmenes que pueden causar defectos congnitos en el beb.  Tome las vitaminas prenatales.  Si est estreida, pruebe un laxante suave (si el mdico lo autoriza). Consuma ms alimentos ricos en  fibra, como vegetales y frutas frescos y cereales integrales. Beba gran cantidad de lquido para mantener la orina de tono claro o color amarillo plido.  Dese baos de asiento con agua tibia para aliviar el dolor o las molestias causadas por las hemorroides. Use una crema para las hemorroides si el mdico la autoriza.  Si tiene venas varicosas, use medias de descanso. Eleve los pies durante 15minutos, 3 o 4veces por da. Limite la cantidad de sal en su dieta.  Evite levantar objetos pesados, use zapatos de tacones bajos y mantenga una buena postura.  Descanse con las piernas elevadas si tiene calambres o dolor de cintura.  Visite a su dentista si no lo ha hecho durante el embarazo. Use un cepillo de dientes blando para higienizarse los dientes y psese el hilo dental con suavidad.  Puede seguir manteniendo relaciones sexuales, a menos que el mdico le indique lo contrario.  No haga viajes largos excepto que sea absolutamente necesario y solo con la autorizacin del mdico.  Tome clases prenatales para entender, practicar y hacer preguntas sobre el trabajo de parto y el parto.  Haga un ensayo de la partida al hospital.  Prepare el bolso que   llevar al hospital.  Prepare la habitacin del beb.  Concurra a todas las visitas prenatales segn las indicaciones de su mdico. SOLICITE ATENCIN MDICA SI:  No est segura de que est en trabajo de parto o de que ha roto la bolsa de las aguas.  Tiene mareos.  Siente clicos leves, presin en la pelvis o dolor persistente en el abdomen.  Tiene nuseas, vmitos o diarrea persistentes.  Tiene secrecin vaginal con mal olor.  Siente dolor al orinar. SOLICITE ATENCIN MDICA DE INMEDIATO SI:   Tiene fiebre.  Tiene una prdida de lquido por la vagina.  Tiene sangrado o pequeas prdidas vaginales.  Siente dolor intenso o clicos en el abdomen.  Sube o baja de peso rpidamente.  Tiene dificultad para respirar y siente dolor de  pecho.  Sbitamente se le hinchan mucho el rostro, las manos, los tobillos, los pies o las piernas.  No ha sentido los movimientos del beb durante una hora.  Siente un dolor de cabeza intenso que no se alivia con medicamentos.  Hay cambios en la visin. Document Released: 07/03/2005 Document Revised: 09/28/2013 [employer] Patient Information 2015 [employer]. This information is not intended to replace advice given to you by your health care provider. Make sure you discuss any questions you have with your health care provider.  

## 2015-02-13 NOTE — Progress Notes (Signed)
Mod leuks on UA 

## 2015-02-13 NOTE — Progress Notes (Addendum)
US last week: 1#14oz (54%) Fasting: 91-173 2hr PP: 75-155 Has only been taking metformin once a day.  Increase to BID.  This should help fasting blood sugars, as well as the others. Dysuria - urine culture sent. 28 week labs today.

## 2015-02-13 NOTE — Addendum Note (Signed)
Addended by: Levie HeritageSTINSON, Jalan Bodi J on: 02/13/2015 10:11 AM   Modules accepted: Orders

## 2015-02-13 NOTE — Progress Notes (Signed)
28 wk labs with TDap vaccine 28 wk Educational material given Elna Breslowarol Hernandez Spanish Interpreter

## 2015-02-13 NOTE — Addendum Note (Signed)
Addended by: Levie HeritageSTINSON, JACOB J on: 02/13/2015 10:12 AM   Modules accepted: Level of Service

## 2015-02-14 LAB — RPR

## 2015-02-14 LAB — HIV ANTIBODY (ROUTINE TESTING W REFLEX): HIV: NONREACTIVE

## 2015-02-15 LAB — URINE CULTURE: Colony Count: 30000

## 2015-02-27 ENCOUNTER — Ambulatory Visit (INDEPENDENT_AMBULATORY_CARE_PROVIDER_SITE_OTHER): Payer: Self-pay | Admitting: Obstetrics and Gynecology

## 2015-02-27 VITALS — BP 122/84 | HR 123 | Temp 97.9°F | Wt 242.1 lb

## 2015-02-27 DIAGNOSIS — O24112 Pre-existing diabetes mellitus, type 2, in pregnancy, second trimester: Secondary | ICD-10-CM

## 2015-02-27 DIAGNOSIS — O3421 Maternal care for scar from previous cesarean delivery: Secondary | ICD-10-CM

## 2015-02-27 DIAGNOSIS — O34219 Maternal care for unspecified type scar from previous cesarean delivery: Secondary | ICD-10-CM

## 2015-02-27 DIAGNOSIS — O0993 Supervision of high risk pregnancy, unspecified, third trimester: Secondary | ICD-10-CM

## 2015-02-27 LAB — POCT URINALYSIS DIP (DEVICE)
Bilirubin Urine: NEGATIVE
GLUCOSE, UA: NEGATIVE mg/dL
Hgb urine dipstick: NEGATIVE
Ketones, ur: 40 mg/dL — AB
Nitrite: NEGATIVE
Protein, ur: NEGATIVE mg/dL
Specific Gravity, Urine: <=1.005 (ref 1.005–1.030)
UROBILINOGEN UA: 0.2 mg/dL (ref 0.0–1.0)
pH: 6 (ref 5.0–8.0)

## 2015-02-27 MED ORDER — ASPIRIN 81 MG PO CHEW: 81.0000 mg | CHEWABLE_TABLET | Freq: Every day | ORAL | Status: DC

## 2015-02-27 MED ORDER — PRENATAL PLUS 27-1 MG PO TABS: 1.0000 | ORAL_TABLET | Freq: Every day | ORAL | Status: AC

## 2015-02-27 MED ORDER — METFORMIN HCL 1000 MG PO TABS: 1000.0000 mg | ORAL_TABLET | Freq: Two times a day (BID) | ORAL | Status: DC

## 2015-02-27 MED ORDER — RANITIDINE HCL 150 MG PO TABS: 150.0000 mg | ORAL_TABLET | Freq: Two times a day (BID) | ORAL | Status: DC

## 2015-02-27 NOTE — Progress Notes (Signed)
Ketones 40 and small leuks on UA; Blanca used for interpreter; needs Rx for PNV, would like Rx for heartburn

## 2015-02-27 NOTE — Progress Notes (Signed)
39 y.o. G4P3003 at 4246w5d here for routine OB visit. Doing well today. Interpreter used for today's visit.   1. T2DM. Continues to take metformin 500 mg BID, glyburide 5 mg BID. Reviewed blood glucose log and still mostly above goal. Increased metformin 1000 mg BID. Continue glyburide 5 mg BID. May need insulin if still no improvement. Counseled on diet/exericse.  2. History of C-section. Initially desired TOLAC, but now considering repeat C-section. Thinks she may want BTL. Discussed today. Patient to discuss with husband and will discuss more at next visit.  3. Routine PNC. Labwork reviewed. VB/FM/PTL precautions given.

## 2015-03-07 ENCOUNTER — Other Ambulatory Visit (HOSPITAL_COMMUNITY): Payer: Self-pay | Admitting: Maternal and Fetal Medicine

## 2015-03-07 ENCOUNTER — Ambulatory Visit (HOSPITAL_COMMUNITY)
Admission: RE | Admit: 2015-03-07 | Discharge: 2015-03-07 | Disposition: A | Discharge status: Home / Self Care | Payer: Self-pay | Source: Ambulatory Visit | Attending: Obstetrics and Gynecology | Admitting: Obstetrics and Gynecology

## 2015-03-07 DIAGNOSIS — O09523 Supervision of elderly multigravida, third trimester: Secondary | ICD-10-CM | POA: Insufficient documentation

## 2015-03-07 DIAGNOSIS — O24313 Unspecified pre-existing diabetes mellitus in pregnancy, third trimester: Secondary | ICD-10-CM

## 2015-03-07 DIAGNOSIS — O34219 Maternal care for unspecified type scar from previous cesarean delivery: Secondary | ICD-10-CM

## 2015-03-07 DIAGNOSIS — O3421 Maternal care for scar from previous cesarean delivery: Secondary | ICD-10-CM | POA: Insufficient documentation

## 2015-03-07 DIAGNOSIS — O352XX0 Maternal care for (suspected) hereditary disease in fetus, not applicable or unspecified: Secondary | ICD-10-CM

## 2015-03-07 DIAGNOSIS — O99210 Obesity complicating pregnancy, unspecified trimester: Secondary | ICD-10-CM

## 2015-03-07 DIAGNOSIS — Z3A29 29 weeks gestation of pregnancy: Secondary | ICD-10-CM | POA: Insufficient documentation

## 2015-03-07 DIAGNOSIS — E119 Type 2 diabetes mellitus without complications: Secondary | ICD-10-CM | POA: Insufficient documentation

## 2015-03-07 DIAGNOSIS — O09522 Supervision of elderly multigravida, second trimester: Secondary | ICD-10-CM

## 2015-03-07 DIAGNOSIS — O09299 Supervision of pregnancy with other poor reproductive or obstetric history, unspecified trimester: Secondary | ICD-10-CM

## 2015-03-07 DIAGNOSIS — O24113 Pre-existing diabetes mellitus, type 2, in pregnancy, third trimester: Secondary | ICD-10-CM | POA: Insufficient documentation

## 2015-03-13 ENCOUNTER — Ambulatory Visit (INDEPENDENT_AMBULATORY_CARE_PROVIDER_SITE_OTHER): Payer: Self-pay | Admitting: Obstetrics and Gynecology

## 2015-03-13 VITALS — BP 130/78 | HR 102 | Temp 98.2°F | Wt 242.3 lb

## 2015-03-13 DIAGNOSIS — O3421 Maternal care for scar from previous cesarean delivery: Secondary | ICD-10-CM

## 2015-03-13 DIAGNOSIS — O24112 Pre-existing diabetes mellitus, type 2, in pregnancy, second trimester: Secondary | ICD-10-CM

## 2015-03-13 DIAGNOSIS — Z789 Other specified health status: Secondary | ICD-10-CM

## 2015-03-13 DIAGNOSIS — O0993 Supervision of high risk pregnancy, unspecified, third trimester: Secondary | ICD-10-CM

## 2015-03-13 DIAGNOSIS — O09523 Supervision of elderly multigravida, third trimester: Secondary | ICD-10-CM

## 2015-03-13 DIAGNOSIS — O34219 Maternal care for unspecified type scar from previous cesarean delivery: Secondary | ICD-10-CM

## 2015-03-13 LAB — POCT URINALYSIS DIP (DEVICE)
Bilirubin Urine: NEGATIVE
GLUCOSE, UA: NEGATIVE mg/dL
Hgb urine dipstick: NEGATIVE
Leukocytes, UA: NEGATIVE
NITRITE: NEGATIVE
PH: 6 (ref 5.0–8.0)
Protein, ur: NEGATIVE mg/dL
Specific Gravity, Urine: 1.02 (ref 1.005–1.030)
Urobilinogen, UA: 0.2 mg/dL (ref 0.0–1.0)

## 2015-03-13 NOTE — Progress Notes (Signed)
Jeanette PerkingMaria [name] used for interpreter Reviewed tip of week with patient & risks of formula

## 2015-03-13 NOTE — Progress Notes (Signed)
Nutrition note: f/u re: GDM diet Pt has lost 9.7# @ [redacted]w[redacted]d. Pt has been checking her BS- fasting: 63-101, 2hr pp: 56-291. Pt repor52ts she thinks the reason her BS have been high is because of her fruit intake. Pt also reports the 291 reading was [facility]. Reviewed CHO servings & how many she can have at each meal and snack. Encouraged no fruit before 10am, to watch portion sizes of fruit & to include the fruit in her recommended number of CHO servings at meals. Pt agrees to make changes & reports no other questions at this time. F/u in 4-6 wks Blondell RevealLaura Tashya Alberty, MS, RD, LDN, [facility]

## 2015-03-13 NOTE — Progress Notes (Signed)
Subjective:   Jeanette MoloneyMaria [name] is a 39 y.o. G4P3003 at 576w5d being seen today for her obstetrical visit.  Patient reports no complaints.   Contractions: Contractions: Not present.   Vaginal Bleeding Vag. Bleeding: None.   Fetal Movement: Movement: Present.   Denies contractions, vaginal bleeding or leaking of fluid.  Reports good fetal movement.  The following portions of the patient's history were reviewed and updated as appropriate: allergies, current medications, past family history, past medical history, past social history, past surgical history and problem list.   Objective:  BP 130/78 mmHg  Pulse 102  Temp(Src) 98.2 F (36.8 C)  Wt 242 lb 4.8 oz (109.907 kg)  LMP 07/15/2014 Fetal Heart Rate: Fetal Heart Rate (bpm): 138  Fundal Height:    Fetal Movement: Movement: Present  Fetal Presentation:     Abdomen: Soft, gravid, appropriate for gestational age.  Pain/Pressure: Pain/Pressure: Present     Vaginal: Vaginal Bleeding Vag. Bleeding: None   Discharge:    Cervix: Dilation:   Effacement:   Station:    Extremities: Edema: Edema: None   Urinalysis: Protein: Urine Protein: Negative (Simultaneous filing. User may not have seen previous data.) Glucose: Urine Glucose: Negative (Simultaneous filing. User may not have seen previous data.) No results found for this or any previous visit (from the past 24 hour(s)).   Assessment and Plan:   Pregnancy:  G4P3003 at 2776w5d  1. Pre-existing type 2 diabetes mellitus during pregnancy in second trimester Reviewed blood glucose log today and improved control although not completely at goal. Will see nutrition today to discuss diet and make appropriate changes.  Continue metformin 1000 BID, glyburide 5 mg BID.  Twice weekly NST starting 32 weeks.  Growth scan last week 1529gm (59%ile). Next scheduled growth scan in 3 weeks.   2. Previous cesarean delivery affecting pregnancy, antepartum Deciding between repeat c-section vs VBAC. Would  like BTL.  Did not sign TOLAC papers today because the patient is still deciding on mode of delivery.   3. Supervision of high-risk pregnancy, third trimester Diabetes.   4. Language barrier affecting health care Spanish speaking. Interpreter used.   5. Advanced maternal age in multigravida, third trimester   Preterm labor symptoms: vaginal bleeding, contractions and leaking of fluid reviewed in detail.  Fetal movement precautions reviewed.  Follow up in 1 week to review blood sugars.   William DaltonMorgan Dajha Urquilla, MD

## 2015-03-13 NOTE — Progress Notes (Signed)
Nutrition note: f/u re: GDM diet Pt has lost 9.7# @ 9516w5d. Pt has been checking her BS:

## 2015-03-20 ENCOUNTER — Ambulatory Visit (INDEPENDENT_AMBULATORY_CARE_PROVIDER_SITE_OTHER): Payer: Self-pay | Admitting: Family Medicine

## 2015-03-20 VITALS — BP 116/69 | HR 98 | Wt 241.4 lb

## 2015-03-20 DIAGNOSIS — O24112 Pre-existing diabetes mellitus, type 2, in pregnancy, second trimester: Secondary | ICD-10-CM

## 2015-03-20 DIAGNOSIS — O0993 Supervision of high risk pregnancy, unspecified, third trimester: Secondary | ICD-10-CM

## 2015-03-20 LAB — POCT URINALYSIS DIP (DEVICE)
Glucose, UA: NEGATIVE mg/dL
Hgb urine dipstick: NEGATIVE
Ketones, ur: 40 mg/dL — AB
Nitrite: NEGATIVE
PROTEIN: 30 mg/dL — AB
Specific Gravity, Urine: 1.025 (ref 1.005–1.030)
UROBILINOGEN UA: 0.2 mg/dL (ref 0.0–1.0)
pH: 6 (ref 5.0–8.0)

## 2015-03-20 MED ORDER — GLYBURIDE 5 MG PO TABS: 5.0000 mg | ORAL_TABLET | Freq: Two times a day (BID) | ORAL | Status: DC

## 2015-03-20 NOTE — Patient Instructions (Signed)
Cardiotocografa en reposo (Nonstress Test)  La cardiotocografa en reposo es un procedimiento que controla los latidos del corazn del feto. El estudio controla los latidos del corazn cuando el feto est en reposo y [place] se [place]. En un feto sano, habr un aumento de la frecuencia cardaca fetal cuando se mueve o patea. La frecuencia cardaca disminuye en reposo. Este examen ayuda a determinar si el feto est sano. El mdico va a revisar una serie de patrones en el ritmo cardaco para asegurarse de que el beb est creciendo bien. Si hay alguna preocupacin, el mdico puede ordenar pruebas adicionales o puede sugerir otro curso de accin. Este estudio se hace en el tercer trimestre del embarazo y puede ayudar a determinar si es seguro y Passenger transport manager un parto anticipado. Las razones ms comunes para Paediatric nurse examen son:   Ya ha pasado la fecha del [place].  Tiene un embarazo de Conservator, museum/gallery.  Siente menos movimientos que lo habitual.  Ha perdido un embarazo en el pasado.  El mdico sospecha problemas en el desarrollo fetal.  Tiene mucho o muy poco lquido amnitico. ANTES DEL PROCEDIMIENTO   Coma un alimento justo antes del examen o como se lo indique su mdico. Los alimentos ayudan a Risk manager los movimientos fetales.  Vaya al bao justo antes del [place]. PROCEDIMIENTO   Le colocarn dos cinturones alrededor del abdomen. Estos cinturones tienen monitores conectados a ellos. Uno registra la frecuencia cardaca fetal y el otro las contracciones uterinas.  Le indicarn que se acueste de lado o permanezca sentada en posicin vertical.  Le darn un pulsador para presionar cuando sienta el movimiento.  Se escucharn los latidos cardacos del feto y se observar en una pantalla. Los latidos cardacos se registran en un papel.  Si el feto parece estar dormido, se le puede pedir que beba un poco de jugo o soda, presione suavemente su abdomen, o haga algo de ruido para  despertarlo. DESPUS DEL PROCEDIMIENTO  El mdico le [employer] del estudio y le har recomendaciones para el futuro prximo.  Document Released: 09/23/2005 Document Revised: 02/07/2014 [facility] Patient Information 2015 [place], [place]. This information is not intended to replace advice given to you by your health care provider. Make sure you discuss any questions you have with your health care provider.

## 2015-03-20 NOTE — Progress Notes (Signed)
Subjective:  Jeanette [name] is a 39 y.o. G4P3003 at [redacted]w[redacted]d being seen today for ongoing prenatal care.  Patient reports no complaints.  Contractions: Not present.  Vag. Bleeding: None. Movement: Present. Denies leaking of fluid.   DM2: patient currently on glyburide 5mg  BID and metformin 1000mg  BID.  Tolerating medication well.  No hypoglycemic events. Fasting: 82-119 2hr PP: 57-141  The following portions of the patient's history were reviewed and updated as appropriate: allergies, current medications, past family history, past medical history, past social history, past surgical history and problem list.   Objective:   Filed Vitals:   03/20/15 1003  BP: 116/69  Pulse: 98  Weight: 241 lb 6.4 oz (109.498 kg)    Fetal Status:  FHR 144   Movement: Present     General:  Alert, oriented and cooperative. Patient is in no acute distress.  Skin: Skin is warm and dry. No rash noted.   Cardiovascular: Normal heart rate noted  Respiratory: Effort and breath sounds normal, no problems with respiration noted  Abdomen: Soft, gravid, appropriate for gestational age. Pain/Pressure: Present     Vaginal: Vag. Bleeding: None.       Cervix: Not evaluated  Extremities: Normal range of motion.  Edema: None  Mental Status: Normal mood and affect. Normal behavior. Normal judgment and thought content.   Urinalysis: Urine Protein: 1+ Urine Glucose: Negative  Assessment and Plan:  Pregnancy: G4P3003 at [redacted]w[redacted]d  1. Supervision of high-risk pregnancy, third trimester FHT normal.  2. Pre-existing type 2 diabetes mellitus during pregnancy in second trimester Not controlled - worsening.  Increase glyburide to 7.5mg  at night Start NST Repeat US in 2 weeks.   Preterm labor symptoms and general obstetric precautions including but not limited to vaginal bleeding, contractions, leaking of fluid and fetal movement were reviewed in detail with the patient.  Please refer to After Visit Summary for other  counseling recommendations.   No Follow-up on file.   Levie Heritage, DO

## 2015-03-23 ENCOUNTER — Ambulatory Visit (INDEPENDENT_AMBULATORY_CARE_PROVIDER_SITE_OTHER): Payer: Self-pay | Admitting: *Deleted

## 2015-03-23 VITALS — BP 110/63 | HR 88

## 2015-03-23 DIAGNOSIS — O24119 Pre-existing diabetes mellitus, type 2, in pregnancy, unspecified trimester: Secondary | ICD-10-CM

## 2015-03-23 NOTE — Patient Instructions (Signed)
Regrese a la clinica cuando tenga su cita. Si tiene problemas o preguntas, llama a la clinica o vaya a la sala de emergencia al [facility].    

## 2015-03-23 NOTE — Progress Notes (Addendum)
NST performed today was reviewed and was found to be reactive.  Had a slight prolonged deceleration in the middle of the NST (nadir of 110 over three minutes), but was reactive before and after this deceleration. Continue recommended antenatal testing and prenatal care.

## 2015-03-27 ENCOUNTER — Ambulatory Visit (HOSPITAL_COMMUNITY)
Admission: RE | Admit: 2015-03-27 | Discharge: 2015-03-27 | Disposition: A | Discharge status: Home / Self Care | Payer: Self-pay | Source: Ambulatory Visit | Attending: Obstetrics & Gynecology | Admitting: Obstetrics & Gynecology

## 2015-03-27 ENCOUNTER — Encounter (HOSPITAL_COMMUNITY): Payer: Self-pay

## 2015-03-27 DIAGNOSIS — O09523 Supervision of elderly multigravida, third trimester: Secondary | ICD-10-CM | POA: Insufficient documentation

## 2015-03-27 DIAGNOSIS — E119 Type 2 diabetes mellitus without complications: Secondary | ICD-10-CM | POA: Insufficient documentation

## 2015-03-27 DIAGNOSIS — O3421 Maternal care for scar from previous cesarean delivery: Secondary | ICD-10-CM | POA: Insufficient documentation

## 2015-03-27 DIAGNOSIS — O24119 Pre-existing diabetes mellitus, type 2, in pregnancy, unspecified trimester: Secondary | ICD-10-CM

## 2015-03-27 DIAGNOSIS — O24113 Pre-existing diabetes mellitus, type 2, in pregnancy, third trimester: Secondary | ICD-10-CM | POA: Insufficient documentation

## 2015-03-27 DIAGNOSIS — Z3A32 32 weeks gestation of pregnancy: Secondary | ICD-10-CM | POA: Insufficient documentation

## 2015-03-27 HISTORY — DX: Type 2 diabetes mellitus without complications: E11.9

## 2015-03-28 ENCOUNTER — Ambulatory Visit (HOSPITAL_COMMUNITY): Payer: Self-pay

## 2015-03-30 ENCOUNTER — Other Ambulatory Visit: Payer: Self-pay

## 2015-04-03 ENCOUNTER — Ambulatory Visit (INDEPENDENT_AMBULATORY_CARE_PROVIDER_SITE_OTHER): Payer: Self-pay | Admitting: Obstetrics and Gynecology

## 2015-04-03 ENCOUNTER — Ambulatory Visit (HOSPITAL_COMMUNITY)
Admission: RE | Admit: 2015-04-03 | Discharge: 2015-04-03 | Disposition: A | Discharge status: Home / Self Care | Payer: Self-pay | Source: Ambulatory Visit | Attending: Maternal and Fetal Medicine | Admitting: Maternal and Fetal Medicine

## 2015-04-03 ENCOUNTER — Encounter (HOSPITAL_COMMUNITY): Payer: Self-pay

## 2015-04-03 ENCOUNTER — Encounter: Payer: Self-pay | Admitting: Obstetrics and Gynecology

## 2015-04-03 ENCOUNTER — Other Ambulatory Visit (HOSPITAL_COMMUNITY): Payer: Self-pay

## 2015-04-03 VITALS — BP 120/72 | HR 91 | Wt 243.7 lb

## 2015-04-03 DIAGNOSIS — O09299 Supervision of pregnancy with other poor reproductive or obstetric history, unspecified trimester: Secondary | ICD-10-CM

## 2015-04-03 DIAGNOSIS — O09529 Supervision of elderly multigravida, unspecified trimester: Secondary | ICD-10-CM | POA: Insufficient documentation

## 2015-04-03 DIAGNOSIS — O34219 Maternal care for unspecified type scar from previous cesarean delivery: Secondary | ICD-10-CM

## 2015-04-03 DIAGNOSIS — O163 Unspecified maternal hypertension, third trimester: Secondary | ICD-10-CM

## 2015-04-03 DIAGNOSIS — Z3A33 33 weeks gestation of pregnancy: Secondary | ICD-10-CM | POA: Insufficient documentation

## 2015-04-03 DIAGNOSIS — O24119 Pre-existing diabetes mellitus, type 2, in pregnancy, unspecified trimester: Secondary | ICD-10-CM

## 2015-04-03 DIAGNOSIS — O24113 Pre-existing diabetes mellitus, type 2, in pregnancy, third trimester: Secondary | ICD-10-CM | POA: Insufficient documentation

## 2015-04-03 DIAGNOSIS — E119 Type 2 diabetes mellitus without complications: Secondary | ICD-10-CM | POA: Insufficient documentation

## 2015-04-03 DIAGNOSIS — O99213 Obesity complicating pregnancy, third trimester: Secondary | ICD-10-CM

## 2015-04-03 DIAGNOSIS — O0993 Supervision of high risk pregnancy, unspecified, third trimester: Secondary | ICD-10-CM

## 2015-04-03 DIAGNOSIS — O09523 Supervision of elderly multigravida, third trimester: Secondary | ICD-10-CM | POA: Insufficient documentation

## 2015-04-03 DIAGNOSIS — O99210 Obesity complicating pregnancy, unspecified trimester: Secondary | ICD-10-CM

## 2015-04-03 DIAGNOSIS — O3421 Maternal care for scar from previous cesarean delivery: Secondary | ICD-10-CM | POA: Insufficient documentation

## 2015-04-03 DIAGNOSIS — O352XX0 Maternal care for (suspected) hereditary disease in fetus, not applicable or unspecified: Secondary | ICD-10-CM | POA: Insufficient documentation

## 2015-04-03 DIAGNOSIS — O09293 Supervision of pregnancy with other poor reproductive or obstetric history, third trimester: Secondary | ICD-10-CM | POA: Insufficient documentation

## 2015-04-03 DIAGNOSIS — O24313 Unspecified pre-existing diabetes mellitus in pregnancy, third trimester: Secondary | ICD-10-CM

## 2015-04-03 DIAGNOSIS — O09522 Supervision of elderly multigravida, second trimester: Secondary | ICD-10-CM

## 2015-04-03 LAB — POCT URINALYSIS DIP (DEVICE)
Bilirubin Urine: NEGATIVE
GLUCOSE, UA: NEGATIVE mg/dL
Hgb urine dipstick: NEGATIVE
Ketones, ur: NEGATIVE mg/dL
LEUKOCYTES UA: NEGATIVE
NITRITE: NEGATIVE
Protein, ur: NEGATIVE mg/dL
Specific Gravity, Urine: 1.01 (ref 1.005–1.030)
UROBILINOGEN UA: 0.2 mg/dL (ref 0.0–1.0)
pH: 7 (ref 5.0–8.0)

## 2015-04-03 NOTE — ED Notes (Signed)
Spanish interpreter [employer] with pt.

## 2015-04-03 NOTE — Progress Notes (Signed)
Subjective:  Johnella MoloneyMaria Yanez-Gonzalez is a 39 y.o. G4P3003 at 2210w5d being seen today for ongoing prenatal care.  Patient reports no complaints.  Contractions: Not present.  Vag. Bleeding: None. Movement: Present. Denies leaking of fluid.   The following portions of the patient's history were reviewed and updated as appropriate: allergies, current medications, past family history, past medical history, past social history, past surgical history and problem list.   Objective:   Filed Vitals:   04/03/15 0940  BP: 120/72  Pulse: 91  Weight: 243 lb 11.2 oz (110.542 kg)    Fetal Status:     Movement: Present     General:  Alert, oriented and cooperative. Patient is in no acute distress.  Skin: Skin is warm and dry. No rash noted.   Cardiovascular: Normal heart rate noted  Respiratory: Normal respiratory effort, no problems with respiration noted  Abdomen: Soft, gravid, appropriate for gestational age. Pain/Pressure: Present     Vaginal: Vag. Bleeding: None.       Cervix: Not evaluated        Extremities: Normal range of motion.  Edema: None  Mental Status: Normal mood and affect. Normal behavior. Normal judgment and thought content.   Urinalysis: Urine Protein: Negative Urine Glucose: Negative  Assessment and Plan:  Pregnancy: G4P3003 at 5710w5d  1. Hypertension complicating pregnancy, third trimester NST reviewed and reactive - Fetal nonstress test BP well controlled- no meds Continue ASA  2. Pre-existing type 2 diabetes affecting pregnancy, antepartum CBGs reviewed and great majority within range Follow up growth ultrasound today - Fetal nonstress test  3. Supervision of high-risk pregnancy, third trimester   4. Previous cesarean delivery affecting pregnancy, antepartum Desires TOLAC- needs to sign consent at next visit  5. Obesity complicating pregnancy, third trimester   6. Advanced maternal age in multigravida, third trimester    Preterm labor symptoms and general  obstetric precautions including but not limited to vaginal bleeding, contractions, leaking of fluid and fetal movement were reviewed in detail with the patient.  Please refer to After Visit Summary for other counseling recommendations.   Return for 2x/wk as scheduled.   Catalina AntiguaPeggy Tillmon Kisling, MD

## 2015-04-03 NOTE — Progress Notes (Signed)
US for growth today 

## 2015-04-04 ENCOUNTER — Other Ambulatory Visit (HOSPITAL_COMMUNITY): Payer: Self-pay | Admitting: Obstetrics and Gynecology

## 2015-04-04 ENCOUNTER — Ambulatory Visit (HOSPITAL_COMMUNITY): Payer: Self-pay

## 2015-04-04 DIAGNOSIS — O09293 Supervision of pregnancy with other poor reproductive or obstetric history, third trimester: Secondary | ICD-10-CM

## 2015-04-04 DIAGNOSIS — O24313 Unspecified pre-existing diabetes mellitus in pregnancy, third trimester: Secondary | ICD-10-CM

## 2015-04-04 DIAGNOSIS — O99213 Obesity complicating pregnancy, third trimester: Secondary | ICD-10-CM

## 2015-04-04 DIAGNOSIS — O09523 Supervision of elderly multigravida, third trimester: Secondary | ICD-10-CM

## 2015-04-04 DIAGNOSIS — O34219 Maternal care for unspecified type scar from previous cesarean delivery: Secondary | ICD-10-CM

## 2015-04-06 ENCOUNTER — Ambulatory Visit (INDEPENDENT_AMBULATORY_CARE_PROVIDER_SITE_OTHER): Payer: Self-pay | Admitting: *Deleted

## 2015-04-06 VITALS — BP 112/65 | HR 93

## 2015-04-06 DIAGNOSIS — O163 Unspecified maternal hypertension, third trimester: Secondary | ICD-10-CM

## 2015-04-06 DIAGNOSIS — O24313 Unspecified pre-existing diabetes mellitus in pregnancy, third trimester: Secondary | ICD-10-CM

## 2015-04-06 NOTE — Progress Notes (Signed)
Interpreter Marly Adams present for encounter 

## 2015-04-06 NOTE — Progress Notes (Signed)
NST performed today was reviewed and was found to be reactive.  Continue recommended antenatal testing and prenatal care.  

## 2015-04-11 ENCOUNTER — Ambulatory Visit (INDEPENDENT_AMBULATORY_CARE_PROVIDER_SITE_OTHER): Payer: Self-pay | Admitting: *Deleted

## 2015-04-11 VITALS — BP 123/73 | HR 97

## 2015-04-11 DIAGNOSIS — O163 Unspecified maternal hypertension, third trimester: Secondary | ICD-10-CM

## 2015-04-11 DIAGNOSIS — O24313 Unspecified pre-existing diabetes mellitus in pregnancy, third trimester: Secondary | ICD-10-CM

## 2015-04-11 NOTE — Progress Notes (Signed)
NST reactive.

## 2015-04-14 ENCOUNTER — Ambulatory Visit (INDEPENDENT_AMBULATORY_CARE_PROVIDER_SITE_OTHER): Payer: Self-pay | Admitting: Family Medicine

## 2015-04-14 ENCOUNTER — Telehealth: Payer: Self-pay | Admitting: *Deleted

## 2015-04-14 VITALS — BP 109/72 | HR 92

## 2015-04-14 DIAGNOSIS — O0993 Supervision of high risk pregnancy, unspecified, third trimester: Secondary | ICD-10-CM

## 2015-04-14 DIAGNOSIS — W19XXXA Unspecified fall, initial encounter: Secondary | ICD-10-CM

## 2015-04-14 DIAGNOSIS — O24313 Unspecified pre-existing diabetes mellitus in pregnancy, third trimester: Secondary | ICD-10-CM

## 2015-04-14 LAB — POCT URINALYSIS DIP (DEVICE)
BILIRUBIN URINE: NEGATIVE
Glucose, UA: NEGATIVE mg/dL
Hgb urine dipstick: NEGATIVE
KETONES UR: NEGATIVE mg/dL
Nitrite: NEGATIVE
Protein, ur: NEGATIVE mg/dL
Specific Gravity, Urine: 1.015 (ref 1.005–1.030)
Urobilinogen, UA: 0.2 mg/dL (ref 0.0–1.0)
pH: 6.5 (ref 5.0–8.0)

## 2015-04-14 NOTE — Progress Notes (Signed)
Interpreter Jeanette [name] present for encounter.  Pt states she fell to her knees last night - did not hit abdomen. At the time of the fall she felt like she "wet herself" - denies any fluid leaking since.  Pt reports good FM since the fall.  Pt desires to travel out of town next week - will discuss with MD.

## 2015-04-14 NOTE — Telephone Encounter (Signed)
Received voicemail message on nurse line left by patient on 04/13/15 at 1844.  Message was in [place].  [name] translated the message and helped me to contact the patient.  The patient's message states that she fell.  States she doesn't have pain.  States she has appointment on 04/14/15 in our office but wonders should she go to Emergency Room.  Spoke with patient via phone.  Asked patient to come in to the office at 9:30 am this morning to see the doctor and then she can have her NST at 10 am.  Patient states understanding.

## 2015-04-14 NOTE — Patient Instructions (Signed)
Follow up for ultrasound as scheduled on Monday and twice weekly NST as scheduled.   Tercer trimestre de Psychiatrist (Third Trimester of Pregnancy) El tercer trimestre va desde la semana29 hasta la 42, desde el sptimo hasta el noveno mes, y es la poca en la que el feto crece ms rpidamente. Hacia el final del noveno mes, el feto mide alrededor de 20pulgadas (45cm) de largo y pesa entre 6 y 10 libras (2,700 y 4,500kg).  CAMBIOS EN EL ORGANISMO Su organismo atraviesa por muchos cambios durante el [place], y estos varan de Neomia Dear mujer a Educational psychologist.   Seguir [employer]. Es de esperar que aumente entre 25 y 35libras (11 y 16kg) hacia el final del Psychiatrist.  Podrn aparecer las primeras [employer] caderas, el abdomen y las [employer].  Puede tener necesidad de Geographical information systems officer con ms frecuencia porque el feto baja hacia la pelvis y ejerce presin sobre la vejiga.  Debido al Vanetta Mulders podr sentir Anthoney Harada estomacal con frecuencia.  Puede estar estreida, ya que ciertas hormonas enlentecen los movimientos de los msculos que [employer] desechos a travs de los intestinos.  Pueden aparecer hemorroides o abultarse e hincharse las venas (venas varicosas).  Puede sentir dolor plvico debido al [place] y a que las hormonas del Management consultant las articulaciones entre los huesos de la pelvis. El dolor de espalda puede ser consecuencia de la sobrecarga de los msculos que soportan la [address].  Tal vez haya cambios en el cabello que pueden incluir su engrosamiento, crecimiento rpido y cambios en la textura. Adems, a algunas mujeres se les cae el cabello durante o despus del embarazo, o tienen el cabello seco o fino. Lo ms probable es que el cabello se le normalice despus del nacimiento del beb.  Las [employer] seguirn creciendo y Development worker, community. A veces, puede haber una secrecin amarilla de las mamas llamada calostro.  El ombligo puede salir hacia afuera.  Puede sentir que le falta el aire debido a  que se expande el tero.  Puede notar que el feto "baja" o lo siente ms bajo, en el abdomen.  Puede tener una prdida de secrecin mucosa con sangre. Esto suele ocurrir en el trmino de unos [address] a una semana antes de que comience el [place].  El cuello del tero se vuelve delgado y blando (se borra) cerca de la fecha de [place]. QU DEBE ESPERAR EN LOS EXMENES PRENATALES  Le harn exmenes prenatales cada 2semanas hasta la semana36. A partir de ese momento le harn exmenes semanales. Durante una visita prenatal de rutina:  La pesarn para asegurarse de que usted y el feto estn creciendo normalmente.  Le tomarn la presin arterial.  Le medirn el abdomen para controlar el desarrollo del beb.  Se escucharn los latidos cardacos fetales.  Se evaluarn los resultados de los estudios solicitados en visitas anteriores.  Le revisarn el cuello del tero cuando est prxima la fecha de parto para controlar si este se ha borrado. Alrededor de la semana36, el mdico le revisar el cuello del tero. Al mismo tiempo, realizar un anlisis de las secreciones del tejido vaginal. Este examen es para determinar si hay un tipo de bacteria, estreptococo Grupo B. El mdico le explicar esto con ms detalle. El mdico puede preguntarle lo siguiente:  Cmo le gustara que fuera el [place].  Cmo se siente.  Si siente los movimientos del beb.  Si ha tenido [employer], como prdida de lquido, [place], dolores de cabeza intensos o  clicos abdominales.  Si tiene [employer]. Otros exmenes o estudios de deteccin que pueden realizarse durante el tercer trimestre incluyen lo siguiente:  Anlisis de sangre para controlar las concentraciones de hierro (anemia).  Controles fetales para determinar su salud, nivel de [place] y Designer, jewellery. Si tiene [place] enfermedad o hay problemas durante el embarazo, le harn estudios. FALSO TRABAJO DE PARTO Es posible que sienta  contracciones leves e irregulares que finalmente desaparecen. Se llaman contracciones de [address] o falso trabajo de [place]. Las [employer] pueden durar horas, [address] o incluso semanas, antes de que el verdadero trabajo de parto se inicie. Si las contracciones ocurren a intervalos regulares, se intensifican o se hacen dolorosas, lo mejor es que la revise el mdico.  SIGNOS DE TRABAJO DE PARTO   Clicos de tipo menstrual.  Contracciones cada o menos.  Contracciones que comienzan en la parte superior del tero y se extienden hacia abajo, a la zona inferior del abdomen y la espalda.  Sensacin de mayor presin en la pelvis o dolor de espalda.  Una secrecin de mucosidad acuosa o con sangre que sale de la vagina. Si tiene alguno de estos signos antes de la semana37 del Psychiatrist, llame a su mdico de inmediato. Debe concurrir al hospital para que la controlen inmediatamente. INSTRUCCIONES PARA EL CUIDADO EN EL HOGAR   Evite fumar, consumir hierbas, beber alcohol y tomar frmacos que no le hayan recetado. Estas sustancias qumicas afectan la formacin y el desarrollo del beb.  Siga las indicaciones del mdico en relacin con el uso de medicamentos. Durante el embarazo, hay medicamentos que son seguros de tomar y otros que no.  Haga actividad fsica solo en la forma indicada por el mdico. Sentir clicos uterinos es un buen signo para Restaurant manager, fast food actividad fsica.  Contine comiendo alimentos que sanos con regularidad.  Use un sostn que le brinde buen soporte si le [employer].  No se d baos de inmersin en agua caliente, baos turcos ni saunas.  Colquese el cinturn de seguridad cuando conduzca.  No coma carne cruda ni queso sin cocinar; evite el contacto con las bandejas sanitarias de los gatos y la tierra que estos animales usan. Estos elementos contienen grmenes que pueden causar defectos congnitos en el beb.  Tome las vitaminas prenatales.  Si est  estreida, pruebe un laxante suave (si el mdico lo autoriza). Consuma ms alimentos ricos en fibra, como vegetales y frutas frescos y Radiation protection practitioner. Beba gran cantidad de lquido para mantener la orina de tono claro o color amarillo plido.  Dese baos de asiento con agua tibia para Engineer, materials o las molestias causadas por las hemorroides. Use una crema para las hemorroides si el mdico la autoriza.  Si tiene venas varicosas, use medias de descanso. Eleve los pies durante , 3 o 4veces por da. Limite la cantidad de sal en su dieta.  Evite levantar objetos pesados, use zapatos de tacones bajos y [place].  Descanse con las piernas elevadas si tiene calambres o dolor de cintura.  Visite a su dentista si no lo ha Occupational hygienist. Use un cepillo de dientes blando para higienizarse los dientes y psese el hilo dental con suavidad.  Puede seguir [employer], a menos que el mdico le indique lo contrario.  No haga viajes largos excepto que sea absolutamente necesario y solo con la autorizacin del mdico.  Tome clases prenatales para Financial trader, Education administrator y hacer preguntas sobre el [place] y  el parto.  Haga un ensayo de la partida al hospital.  Prepare el bolso que llevar al hospital.  Prepare la habitacin del beb.  Concurra a todas las visitas prenatales segn las indicaciones de su mdico. SOLICITE ATENCIN MDICA SI:  No est segura de que est en trabajo de parto o de que ha roto la bolsa de las aguas.  Tiene mareos.  Siente clicos leves, presin en la pelvis o dolor persistente en el abdomen.  Tiene nuseas, vmitos o diarrea persistentes.  Tiene secrecin vaginal con mal olor.  Siente dolor al ConocoPhillipsorinar. SOLICITE ATENCIN MDICA DE INMEDIATO SI:   Tiene fiebre.  Tiene una prdida de lquido por la vagina.  Tiene sangrado o pequeas prdidas vaginales.  Siente dolor intenso o clicos en el  abdomen.  Sube o baja de peso rpidamente.  Tiene dificultad para respirar y siente dolor de pecho.  Sbitamente se le hinchan mucho el rostro, las [place]ymanos, los tobillos, los pies o las piernas.  No ha sentido los movimientos del beb durante Georgianne Fickuna hora.  Siente un dolor de cabeza intenso que no se alivia con medicamentos.  Hay cambios en la visin. Document Released: 07/03/2005 Document Revised: 09/28/2013 [facility] Patient Information 2015 [place] VistaExitCare, MarylandLLC. This information is not intended to replace advice given to you by your health care provider. Make sure you discuss any questions you have with your health care provider.

## 2015-04-14 NOTE — Progress Notes (Signed)
Subjective:  Jeanette [name] is a 39 y.o. G4P3003 at 6663w2d being seen today for ongoing prenatal care.  Spanish interpretation provided by in house interpreter Dorita.  Patient reports no complaints.   .   . Movement: Present. Denies leaking of fluid.  Baby is moving around normally. Fell on knees last night when playing with her daughter.  She reports that she put her arms out to steady herself and did not hit her abdomen at all.  She does report a small amount of fluid in her panties shortly after.  She believes this to be urine.  She had not further wetness after the incident.  No vaginal bleeding or abdominal cramping/contractions.  BG 70's-90's fasting and 110-130's postprandial (with occasional BG in low 200's).  She reports occasional shakes when she has a low blood sugar.  This happens seldomly.  She eats a piece of fruit to help with this.  Continues to take medication as directed.  She does not miss doses.    The following portions of the patient's history were reviewed and updated as appropriate: allergies, current medications, past family history, past medical history, past social history, past surgical history and problem list.   Objective:   Filed Vitals:   04/14/15 1009  BP: 109/72  Pulse: 92    Fetal Status:     Movement: Present     General:  Alert, oriented and cooperative. Patient is in no acute distress.  Skin: Skin is warm and dry. No rash noted.   Cardiovascular: Normal heart rate noted  Respiratory: Normal respiratory effort, no problems with respiration noted  Abdomen: Soft, gravid, appropriate for gestational age.       Vaginal:  .     not examined   Cervix: Not evaluated        Extremities: Normal range of motion.     Mental Status: Normal mood and affect. Normal behavior. Normal judgment and thought content.   Urinalysis:      Assessment and Plan:  Pregnancy: G4P3003 at 3663w2d  1. Supervision of high-risk pregnancy, third trimester Twice weekly NST  scheduled.  2. Pre-existing diabetes mellitus in pregnancy in third trimester Continue taking current medications as directed Continue monitoring CBGs as directed  3. Fall, initial encounter: NST reactive. No trauma to abdomen.  No vaginal bleeding.  No contractions.  No evidence of labor at this time. Return precautions reviewed. Follow up next week as directed   Term labor symptoms and general obstetric precautions including but not limited to vaginal bleeding, contractions, leaking of fluid and fetal movement were reviewed in detail with the patient.  Please refer to After Visit Summary for other counseling recommendations.   Return for 2x/wk as scheduled.   Raliegh IpAshly M Kaleem Sartwell, DO

## 2015-04-14 NOTE — Progress Notes (Signed)
Attestation of Attending Supervision of Resident: Evaluation and management procedures were performed by the Resident under my supervision and collaboration.  I have seen and examined the patient.  I agree with the Resident's note and Assessment and Plan.  Jacob Stinson, DO Attending Physician Faculty Practice, [facility]   

## 2015-04-17 ENCOUNTER — Ambulatory Visit (INDEPENDENT_AMBULATORY_CARE_PROVIDER_SITE_OTHER): Payer: Self-pay | Admitting: Family Medicine

## 2015-04-17 ENCOUNTER — Ambulatory Visit (HOSPITAL_COMMUNITY)
Admission: RE | Admit: 2015-04-17 | Discharge: 2015-04-17 | Disposition: A | Discharge status: Home / Self Care | Payer: Self-pay | Source: Ambulatory Visit | Attending: Family Medicine | Admitting: Family Medicine

## 2015-04-17 VITALS — BP 120/80 | HR 88 | Wt 246.3 lb

## 2015-04-17 DIAGNOSIS — O24313 Unspecified pre-existing diabetes mellitus in pregnancy, third trimester: Secondary | ICD-10-CM | POA: Insufficient documentation

## 2015-04-17 DIAGNOSIS — Z3A35 35 weeks gestation of pregnancy: Secondary | ICD-10-CM | POA: Insufficient documentation

## 2015-04-17 DIAGNOSIS — O163 Unspecified maternal hypertension, third trimester: Secondary | ICD-10-CM

## 2015-04-17 DIAGNOSIS — O09893 Supervision of other high risk pregnancies, third trimester: Secondary | ICD-10-CM

## 2015-04-17 LAB — POCT URINALYSIS DIP (DEVICE)
BILIRUBIN URINE: NEGATIVE
GLUCOSE, UA: NEGATIVE mg/dL
Hgb urine dipstick: NEGATIVE
Ketones, ur: NEGATIVE mg/dL
NITRITE: NEGATIVE
PH: 7 (ref 5.0–8.0)
Protein, ur: NEGATIVE mg/dL
Specific Gravity, Urine: 1.02 (ref 1.005–1.030)
Urobilinogen, UA: 0.2 mg/dL (ref 0.0–1.0)

## 2015-04-17 NOTE — Progress Notes (Signed)
Interpreter [name] present for encounter.  Pt has BPP today @ 1100 because she will not be in town for NST later this week.

## 2015-04-17 NOTE — Progress Notes (Signed)
Subjective:  Jeanette MoloneyMaria [name] is a 39 y.o. G4P3003 at 2565w5d being seen today for ongoing prenatal care.  Patient reports hypoglycemia x 1.  Contractions: Not present.  Vag. Bleeding: None. Movement: Present. Denies leaking of fluid.   DM: at last visit 7/8, currently on glyburide and metformin.   Fasting: 94, 71, 48 2h PP bfast: 136, 75 2h PP lunch: 138, 140, 66 2h PP dinner: 169, 127, 105  Pt reports for dinner she had chicken breast, lettuce, vegetables and small scoop of icecream and then had CBG fasting 48.  Is concerned whether she needs to eat MORE to continue medication.  The following portions of the patient's history were reviewed and updated as appropriate: allergies, current medications, past family history, past medical history, past social history, past surgical history and problem list.   Objective:   Filed Vitals:   04/17/15 0943  BP: 120/80  Pulse: 88  Weight: 246 lb 4.8 oz (111.721 kg)    Fetal Status:     Movement: Present     General:  Alert, oriented and cooperative. Patient is in no acute distress.  Skin: Skin is warm and dry. No rash noted.   Cardiovascular: Normal heart rate noted  Respiratory: Normal respiratory effort, no problems with respiration noted  Abdomen: Soft, gravid, appropriate for gestational age. Pain/Pressure: Present     Vaginal: Vag. Bleeding: None.       Cervix: Not evaluated        Extremities: Normal range of motion.  Edema: None  Mental Status: Normal mood and affect. Normal behavior. Normal judgment and thought content.   Urinalysis: Urine Protein: Negative Urine Glucose: Negative  Assessment and Plan:  Pregnancy: G4P3003 at 4365w5d  1. Hypertension complicating pregnancy, third trimester - Fetal nonstress test  2. Pre-existing diabetes mellitus in pregnancy in third trimester - Fetal nonstress test - hypoglycemia reviewed, advised pt to adhere to diabetic diet.  If needed will decrease glyburide.  Pt declined to meet with  nutritionist again - suspect pt now eating proper diet and glucose now within more normal range given lowest ranges were from yesterday.  No medication changes today.  Preterm labor symptoms and general obstetric precautions including but not limited to vaginal bleeding, contractions, leaking of fluid and fetal movement were reviewed in detail with the patient.  Please refer to After Visit Summary for other counseling recommendations.   Return for 2x/wk as scheduled.   Fredirick LatheKristy Thiago Ragsdale, MD

## 2015-04-21 ENCOUNTER — Other Ambulatory Visit: Payer: Self-pay

## 2015-04-24 ENCOUNTER — Ambulatory Visit (INDEPENDENT_AMBULATORY_CARE_PROVIDER_SITE_OTHER): Payer: Self-pay | Admitting: Obstetrics & Gynecology

## 2015-04-24 VITALS — BP 127/79 | HR 90 | Wt 248.2 lb

## 2015-04-24 DIAGNOSIS — O24313 Unspecified pre-existing diabetes mellitus in pregnancy, third trimester: Secondary | ICD-10-CM

## 2015-04-24 LAB — POCT URINALYSIS DIP (DEVICE)
BILIRUBIN URINE: NEGATIVE
GLUCOSE, UA: NEGATIVE mg/dL
HGB URINE DIPSTICK: NEGATIVE
Ketones, ur: NEGATIVE mg/dL
NITRITE: NEGATIVE
Protein, ur: NEGATIVE mg/dL
Specific Gravity, Urine: 1.01 (ref 1.005–1.030)
Urobilinogen, UA: 0.2 mg/dL (ref 0.0–1.0)
pH: 6 (ref 5.0–8.0)

## 2015-04-24 NOTE — Patient Instructions (Signed)
Intrauterine Device Information An intrauterine device (IUD) is inserted into your uterus to prevent pregnancy. There are two types of IUDs available:   Copper IUD--This type of IUD is wrapped in copper wire and is placed inside the uterus. Copper makes the uterus and fallopian tubes produce a fluid that kills sperm. The copper IUD can stay in place for 10 years.  Hormone IUD--This type of IUD contains the hormone progestin (synthetic progesterone). The hormone thickens the cervical mucus and prevents sperm from entering the uterus. It also thins the uterine lining to prevent implantation of a fertilized egg. The hormone can weaken or kill the sperm that get into the uterus. One type of hormone IUD can stay in place for 5 years, and another type can stay in place for 3 years. Your health care provider will make sure you are a good candidate for a contraceptive IUD. Discuss with your health care provider the possible side effects.  ADVANTAGES OF AN INTRAUTERINE DEVICE  IUDs are highly effective, reversible, long acting, and low maintenance.   There are no estrogen-related side effects.   An IUD can be used when breastfeeding.   IUDs are not associated with weight gain.   The copper IUD works immediately after insertion.   The hormone IUD works right away if inserted within 7 days of your period starting. You will need to use a backup method of birth control for 7 days if the hormone IUD is inserted at any other time in your cycle.  The copper IUD does not interfere with your female hormones.   The hormone IUD can make heavy menstrual periods lighter and decrease cramping.   The hormone IUD can be used for 3 or 5 years.   The copper IUD can be used for 10 years. DISADVANTAGES OF AN INTRAUTERINE DEVICE  The hormone IUD can be associated with irregular bleeding patterns.   The copper IUD can make your menstrual flow heavier and more painful.   You may experience cramping and  vaginal bleeding after insertion.  Document Released: 08/27/2004 Document Revised: 05/26/2013 Document Reviewed: 03/14/2013 ExitCare Patient Information 2015 ExitCare, LLC. This information is not intended to replace advice given to you by your health care provider. Make sure you discuss any questions you have with your health care provider.  

## 2015-04-24 NOTE — Progress Notes (Signed)
Interpreter [name] present for encounter.  Pt reports decreased FM since yesterday, increased pelvic pressure and vaginal pain. Pt felt good FM during NST today.  US for growth scheduled 7/25.

## 2015-04-24 NOTE — Progress Notes (Signed)
NST reactive. CBGs are nml fasting and pp breakfast and lunch.  Some elevations after dinner.  Continue glyburide and metfromin.  Cont 2x week testing Hernia--general surgery consult.  Pt thought he was coming to our clnic to discuss.   VBAC consent needed  Subjective:  Jeanette [name] is a 39 y.o. G4P3003 at 8433w5d being seen today for ongoing prenatal care.  Patient reports no complaints.  Contractions: Not present.  Vag. Bleeding: None. Movement: (!) Decreased. Denies leaking of fluid.   NST is reactive and reassuring.  Movement nml during exam.    The following portions of the patient's history were reviewed and updated as appropriate: allergies, current medications, past family history, past medical history, past social history, past surgical history and problem list.   Objective:   Filed Vitals:   04/24/15 0949  BP: 127/79  Pulse: 90  Weight: 248 lb 3.2 oz (112.583 kg)    Fetal Status: Fetal Heart Rate (bpm): NST   Movement: (!) Decreased  Presentation: Vertex  General:  Alert, oriented and cooperative. Patient is in no acute distress.  Skin: Skin is warm and dry. No rash noted.   Cardiovascular: Normal heart rate noted  Respiratory: Normal respiratory effort, no problems with respiration noted  Abdomen: Soft, gravid, appropriate for gestational age. Pain/Pressure: Present     Vaginal: Vag. Bleeding: None.       Cervix: Not evaluated        Extremities: Normal range of motion.  Edema: None  Mental Status: Normal mood and affect. Normal behavior. Normal judgment and thought content.   Urinalysis:     Not returned upon closing chart--RN to document later.   Assessment and Plan:  Pregnancy: G4P3003 at 3833w5d  1. Pre-existing diabetes mellitus in pregnancy in third trimester NST reactive. CBGs are nml fasting and pp breakfast and lunch.  Some elevations after dinner.  Continue glyburide and metfromin.  Cont 2x week testing  - Amniotic fluid index with NST   2.   Umbilical hernia Hernia--general surgery consult.  Pt thought he was coming to our clnic to discuss.  Pt refuses consult to general surgery today.  She will f/u with them postpartum VBAC consent needed.  Pt given form in spanish and will sign and bring back to us net week. Term labor symptoms and general obstetric precautions including but not limited to vaginal bleeding, contractions, leaking of fluid and fetal movement were reviewed in detail with the patient. Please refer to After Visit Summary for other counseling recommendations.  Return in about 1 week (around 05/01/2015).   Lesly DukesKelly H Leggett, MD

## 2015-04-28 ENCOUNTER — Ambulatory Visit (INDEPENDENT_AMBULATORY_CARE_PROVIDER_SITE_OTHER): Payer: Self-pay | Admitting: *Deleted

## 2015-04-28 VITALS — BP 113/68 | HR 88

## 2015-04-28 DIAGNOSIS — O24313 Unspecified pre-existing diabetes mellitus in pregnancy, third trimester: Secondary | ICD-10-CM

## 2015-04-28 DIAGNOSIS — O163 Unspecified maternal hypertension, third trimester: Secondary | ICD-10-CM

## 2015-04-28 NOTE — Progress Notes (Signed)
[place] for growth on 7/25

## 2015-05-01 ENCOUNTER — Encounter (HOSPITAL_COMMUNITY): Payer: Self-pay

## 2015-05-01 ENCOUNTER — Ambulatory Visit (INDEPENDENT_AMBULATORY_CARE_PROVIDER_SITE_OTHER): Payer: Self-pay | Admitting: Family Medicine

## 2015-05-01 ENCOUNTER — Ambulatory Visit (HOSPITAL_COMMUNITY)
Admission: RE | Admit: 2015-05-01 | Discharge: 2015-05-01 | Disposition: A | Discharge status: Home / Self Care | Payer: Self-pay | Source: Ambulatory Visit | Attending: Obstetrics & Gynecology | Admitting: Obstetrics & Gynecology

## 2015-05-01 VITALS — BP 117/81 | HR 88 | Wt 250.3 lb

## 2015-05-01 DIAGNOSIS — O163 Unspecified maternal hypertension, third trimester: Secondary | ICD-10-CM

## 2015-05-01 DIAGNOSIS — O3421 Maternal care for scar from previous cesarean delivery: Secondary | ICD-10-CM | POA: Insufficient documentation

## 2015-05-01 DIAGNOSIS — O99213 Obesity complicating pregnancy, third trimester: Secondary | ICD-10-CM | POA: Insufficient documentation

## 2015-05-01 DIAGNOSIS — O24313 Unspecified pre-existing diabetes mellitus in pregnancy, third trimester: Secondary | ICD-10-CM | POA: Insufficient documentation

## 2015-05-01 DIAGNOSIS — O09529 Supervision of elderly multigravida, unspecified trimester: Secondary | ICD-10-CM | POA: Insufficient documentation

## 2015-05-01 DIAGNOSIS — O09523 Supervision of elderly multigravida, third trimester: Secondary | ICD-10-CM | POA: Insufficient documentation

## 2015-05-01 DIAGNOSIS — E669 Obesity, unspecified: Secondary | ICD-10-CM | POA: Insufficient documentation

## 2015-05-01 DIAGNOSIS — Z3A37 37 weeks gestation of pregnancy: Secondary | ICD-10-CM | POA: Insufficient documentation

## 2015-05-01 DIAGNOSIS — O09293 Supervision of pregnancy with other poor reproductive or obstetric history, third trimester: Secondary | ICD-10-CM | POA: Insufficient documentation

## 2015-05-01 DIAGNOSIS — O0993 Supervision of high risk pregnancy, unspecified, third trimester: Secondary | ICD-10-CM

## 2015-05-01 DIAGNOSIS — O34219 Maternal care for unspecified type scar from previous cesarean delivery: Secondary | ICD-10-CM

## 2015-05-01 DIAGNOSIS — O24119 Pre-existing diabetes mellitus, type 2, in pregnancy, unspecified trimester: Secondary | ICD-10-CM

## 2015-05-01 LAB — POCT URINALYSIS DIP (DEVICE)
Bilirubin Urine: NEGATIVE
Glucose, UA: NEGATIVE mg/dL
Hgb urine dipstick: NEGATIVE
KETONES UR: NEGATIVE mg/dL
Nitrite: NEGATIVE
PH: 6 (ref 5.0–8.0)
PROTEIN: NEGATIVE mg/dL
SPECIFIC GRAVITY, URINE: 1.015 (ref 1.005–1.030)
Urobilinogen, UA: 1 mg/dL (ref 0.0–1.0)

## 2015-05-01 LAB — OB RESULTS CONSOLE GBS: STREP GROUP B AG: POSITIVE

## 2015-05-01 NOTE — Progress Notes (Signed)
[place] for growth done today.  Pt to sign VBAC consent today.  IOL scheduled 8/3 @ 0730.

## 2015-05-01 NOTE — ED Notes (Signed)
Spanish interpreter Alis Herrera with pt. 

## 2015-05-01 NOTE — Patient Instructions (Signed)
Diabetes mellitus gestacional (Gestational Diabetes Mellitus) La diabetes mellitus gestacional, ms comnmente conocida como diabetes gestacional es un tipo de diabetes que desarrollan algunas mujeres durante el embarazo. En la diabetes gestacional, el pncreas no produce suficiente insulina (una hormona) o las clulas son menos sensibles a la insulina producida (resistencia a la insulina), o ambas cosas. Normalmente, la insulina mueve los azcares de los alimentos a las clulas de los tejidos. Las clulas de los tejidos utilizan los azcares para obtener energa. La falta de insulina o la falta de una respuesta normal a la insulina hace que el exceso de azcar se acumule en la sangre en lugar de penetrar en las clulas de los tejidos. Como resultado, se producen niveles altos de azcar en la sangre (hiperglucemia). El efecto de los niveles altos de azcar (glucosa) puede causar muchos problemas.  FACTORES DE RIESGO Usted tiene mayor probabilidad de desarrollar diabetes gestacional si tiene antecedentes familiares de diabetes y tambin si tiene uno o ms de los siguientes factores de riesgo:  ndice de masa corporal superior a 30 (obesidad).  Embarazo previo con diabetes gestacional.  La edad avanzada en el momento del embarazo. Si se mantienen los niveles de glucosa en la sangre en un rango normal durante el embarazo, las mujeres pueden tener un embarazo saludable. Si los niveles de glucosa en la sangre no estn bien controlados, puede haber riesgos para usted, el feto o el recin nacido, o durante el trabajo de parto y el parto.  SNTOMAS  Si se presentan sntomas, stos son similares a los sntomas que normalmente experimentar durante el embarazo. Los sntomas de la diabetes gestacional son:   Aumento de la sed (polidipsia).  Aumento de la miccin (poliuria).  Orina con ms frecuencia durante la noche (nocturia).  Prdida de peso. La prdida de peso puede ser muy rpida.  Infecciones  frecuentes y recurrentes.  Cansancio (fatiga).  Debilidad.  Cambios en la visin, como visin borrosa.  Olor a fruta en el aliento.  Dolor abdominal. DIAGNSTICO La diabetes se diagnostica cuando hay aumento de los niveles de glucosa en la sangre. El nivel de glucosa en la sangre puede controlarse en uno o ms de los siguientes anlisis de sangre:  Medicin de glucosa en la sangre en ayunas. No se le permitir comer durante al menos 8 horas antes de que se tome una muestra de sangre.  Pruebas al azar de glucosa en la sangre. El nivel de glucosa en la sangre se controla en cualquier momento del da sin importar el momento en que haya comido.  Prueba de A1c (hemoglobina glucosilada) Una prueba de A1c proporciona informacin sobre el control de la glucosa en la sangre durante los ltimos 3 meses.  Prueba de tolerancia a la glucosa oral (PTGO). La glucosa en la sangre se mide despus de no haber comido (ayunas) durante una a tres horas y despus de beber una bebida que contenga glucosa. Dado que las hormonas que causan la resistencia a la insulina son ms altas alrededor de las semanas 24 a 28 de embarazo, generalmente se realiza una PTGO durante ese tiempo. Si tiene factores de riesgo de diabetes gestacional, su mdico puede hacerle estudios de deteccin antes de las 24semanas de embarazo. TRATAMIENTO   Usted tendr que tomar medicamentos para la diabetes o insulina diariamente para mantener los niveles de glucosa en la sangre en el rango deseado.  Usted tendr que combinar la dosis de insulina con la actividad fsica y la eleccin de alimentos saludables. El objetivo del   tratamiento es mantener el nivel de azcar en la sangre previo a comer (preprandial) y durante la noche entre 60 y 99mg/dl, durante todo el embarazo. El objetivo del tratamiento es mantener el nivel pico de azcar en la sangre despus de comer (glucosa posprandial) entre 100y 140mg/dl. INSTRUCCIONES PARA EL CUIDADO EN EL  HOGAR   Controle su nivel de hemoglobina A1c dos veces al ao.  Contrlese a diario el nivel de glucosa en la sangre segn las indicaciones de su mdico. Es comn realizar controles frecuentes de la glucosa en la sangre.  Supervise las cetonas en la orina cuando est enferma y segn las indicaciones de su mdico.  Tome el medicamento para la diabetes y adminstrese insulina segn las indicaciones de su mdico para mantener el nivel de glucosa en la sangre en el rango deseado.  Nunca se quede sin medicamento para la diabetes o sin insulina. Es necesario que la reciba todos los das.  Ajuste la insulina segn la ingesta de hidratos de carbono. Los hidratos de carbono pueden aumentar los niveles de glucosa en la sangre, pero deben incluirse en su dieta. Los hidratos de carbono aportan vitaminas, minerales y fibra que son una parte esencial de una dieta saludable. Los hidratos de carbono se encuentran en frutas, verduras, cereales integrales, productos lcteos, legumbres y alimentos que contienen azcares aadidos.  Consuma alimentos saludables. Alterne 3 comidas con 3 colaciones.  Aumente de peso saludablemente. El aumento del peso total vara de acuerdo con el ndice de masa corporal que tena antes del embarazo ([facility]).  Lleve una tarjeta de alerta mdica o use una pulsera o medalla de alerta mdica.  Lleve con usted una colacin de 15gramos de hidratos de carbono en todo momento para controlar los niveles bajos de glucosa en la sangre (hipoglucemia). Algunos ejemplos de colaciones de 15gramos de hidratos de carbono son los siguientes:  Tabletas de glucosa, 3 o 4.  Gel de glucosa, tubo de 15 gramos.  Pasas de uva, 2 cucharadas (24 g).  Caramelos de goma, 6.  Galletas de animales, 8.  Jugo de fruta, gaseosa comn, o leche descremada, 4 onzas (120 ml).  Pastillas de goma, 9.  Reconocer la hipoglucemia. Durante el embarazo la hipoglucemia se produce cuando hay niveles de glucosa en la  sangre de 60 mg/dl o menos. El riesgo de hipoglucemia aumenta durante el ayuno o cuando se saltea las comidas, durante o despus de realizar ejercicio intenso y mientras duerme. Los sntomas de hipoglucemia son:  Temblores o sacudidas.  Disminucin de la capacidad de concentracin.  Sudoracin.  Aumento de la frecuencia cardaca.  Dolor de cabeza.  Sequedad en la boca.  Hambre.  Irritabilidad.  Ansiedad.  Sueo agitado.  Alteracin del habla o de la coordinacin.  Confusin.  Tratar la hipoglucemia rpidamente. Si usted est alerta y puede tragar con seguridad, siga la regla de 15/15 que consiste en:  Tome entre 15 y 20gramos de glucosa de accin rpida o carbohidratos. Las opciones de accin rpida son un gel de glucosa, tabletas de glucosa, o 4 onzas (120 ml) de jugo de frutas, gaseosa comn, o leche baja en grasa.  Compruebe su nivel de glucosa en la sangre 15 minutos despus de tomar la glucosa.  Tome entre 15 y 20 gramos ms de glucosa si el nivel de glucosa en la sangre todava es de 70mg/dl o inferior.  Ingiera una comida o una colacin en el lapso de 1 hora una vez que los niveles de glucosa en la sangre vuelven   a la normalidad.  Est atento a la poliuria (miccin excesiva) y la polidipsia (sensacin de mucha sed), que son los primeros signos de la hiperglucemia. El reconocimiento temprano de la hiperglucemia permite un tratamiento oportuno. Trate la hiperglucemia segn le indic su mdico.  Haga actividad fsica por lo menos 30minutos al da o como lo indique su mdico. Se recomienda que 30 minutos despus de cada comida, realice diez minutos de actividad fsica para controlar los niveles de glucosa postprandial en la sangre.  Ajuste su dosis de insulina y la ingesta de alimentos, segn sea necesario, si inicia un nuevo ejercicio o deporte.  Siga su plan para los das de enfermedad cuando no pueda comer o beber como de costumbre.  Evite el tabaco y el  alcohol.  Concurra a todas las visitas de control como se lo haya indicado el mdico.  Siga el consejo del mdico respecto a los controles prenatales y posteriores al parto (postparto), las visitas, la planificacin de las comidas, el ejercicio, los medicamentos, las vitaminas, los anlisis de sangre, otras pruebas mdicas y actividades fsicas.  Realice diariamente el cuidado de la piel y de los pies. Examine su piel y los pies diariamente para ver si tiene cortes, moretones, enrojecimiento, problemas en las uas, sangrado, ampollas o llagas.  Cepllese los dientes y encas por lo menos dos veces al da y use hilo dental al menos una vez por da. Concurra regularmente a las visitas de control con el dentista.  Programe un examen de vista durante el primer trimestre de su embarazo o como lo indique su mdico.  Comparta su plan de control de diabetes en el trabajo o en la escuela.  Mantngase al da con las vacunas.  Aprenda a manejar el estrs.  Obtenga la mayor cantidad posible de informacin sobre la diabetes y solicite ayuda siempre que sea necesario.  Obtenga informacin sobre el amamantamiento y analice esta posibilidad.  Debe controlar el nivel de azcar en la sangre de 6a 12semanas despus del parto. Esto se hace con una prueba de tolerancia a la glucosa oral (PTGO). SOLICITE ATENCIN MDICA SI:   No puede comer alimentos o beber por ms de 6 horas.  Tuvo nuseas o ha vomitado durante ms de 6 horas.  Tiene un nivel de glucosa en la sangre de 200 mg/dl y cetonas en la orina.  Presenta algn cambio en el estado mental.  Desarrolla problemas de visin.  Sufre un dolor persistente de cabeza.  Siente dolor o molestias en la parte superior del abdomen.  Desarrolla una enfermedad grave adicional.  Tuvo diarrea durante ms de 6 horas.  Ha estado enfermo o ha tenido fiebre durante un par de das y no mejora. SOLICITE ATENCIN MDICA DE INMEDIATO SI:   Tiene dificultad  para respirar.  Ya no siente los movimientos del beb.  Est sangrando o tiene flujo vaginal.  Comienza a tener contracciones o trabajo de parto prematuro. ASEGRESE DE QUE:  Comprende estas instrucciones.  Controlar su afeccin.  Recibir ayuda de inmediato si no mejora o si empeora. Document Released: 07/03/2005 Document Revised: 02/07/2014 [employer] Patient Information 2015 [employer]. This information is not intended to replace advice given to you by your health care provider. Make sure you discuss any questions you have with your health care provider.  Eleccin del mtodo anticonceptivo (Contraception Choices) La anticoncepcin (control de la natalidad) es el uso de cualquier mtodo o dispositivo para evitar el embarazo. A continuacin se indican algunos de esos mtodos. MTODOS HORMONALES     El Implante contraconceptivo consiste en un tubo plstico delgado que contiene la hormona progesterona. No contiene estrgenos. El mdico inserta el tubo en la parte interna del brazo. El tubo puede permanecer en el lugar durante 3 aos. Despus de los 3 aos debe retirarse. El implante impide que los ovarios liberen vulos (ovulacin), espesa el moco cervical, lo que evita que los espermatozoides ingresen al tero y hace ms delgada la membrana que cubre el interior del tero.  Inyecciones de progesterona sola: las administra el mdico cada 3 meses para evitar el embarazo. La progesterona sinttica impide que los ovarios liberen vulos. Tambin hacen que el moco cervical se espese y modifique el tejido de recubrimiento interno del tero. Esto hace ms difcil que los espermatozoides sobrevivan en el tero.  Las pldoras anticonceptivas contienen estrgenos y progesterona. Su funcin es evitar que los ovarios liberen vulos (ovulacin). Las hormonas de los anticonceptivos orales hacen que el moco cervical se haga ms espeso, lo que evita que el esperma ingrese al tero. Las pldoras  anticonceptivas son recetadas por el mdico.Tambin se utilizan para tratar los perodos menstruales abundantes.  Minipldora: este tipo de pldora anticonceptiva contiene slo hormona progesterona. Deben tomarse todos los das del mes y debe recetarlas el mdico.  El parche de control de natalidad: contiene hormonas similares a las que contienen las pldoras anticonceptivas. Deben cambiarse una vez por semana y se utilizan bajo prescripcin mdica.  Anillo vaginal: contiene hormonas similares a las que contienen las pldoras anticonceptivas. Se deja colocado durante tres semanas, se lo retira durante 1 semana y luego se coloca uno nuevo. La paciente debe sentirse cmoda al insertar y retirar el anillo de la vagina.Es necesaria la prescripcin mdica.  Anticonceptivos de emergencia: son mtodos para evitar un embarazo despus de una relacin sexual sin proteccin. Esta pldora puede tomarse inmediatamente despus de tener relaciones sexuales o hasta 5 das de haber tenido sexo sin proteccin. Es ms efectiva si se toma poco tiempo despus de la relacin sexual. Los anticonceptivos de emergencia estn disponibles sin prescripcin mdica. Consltelo con su farmacutico. No use los anticonceptivos de emergencia como nico mtodo anticonceptivo. MTODOS DE BARRERA   Condn masculino: es una vaina delgada (ltex o goma) que se coloca cubriendo al pene durante el acto sexual. Puede usarse con espermicida para aumentar la efectividad.  Condn femenino. Es una funda delicada y blanda que se adapta holgadamente a la vagina antes de las relaciones sexuales.  Diafragma: es una barrera de ltex redonda y suave que debe ser recomendado por un profesional. Se inserta en la vagina, junto con un gel espermicida. Debe insertarse antes de tener relaciones sexuales. Debe dejar el diafragma colocado en la vagina durante 6 a 8 horas despus de la relacin sexual.  Capuchn cervical: es una barrera de ltex o taza  plstica redonda y suave que cubre el cuello del tero y debe ser colocada por un mdico. Puede dejarlo colocado en la vagina hasta 48 horas despus de las relaciones sexuales.  Esponja: es una pieza blanda y circular de espuma de poliuretano. Contiene un espermicida. Se inserta en la vagina despus de mojarla y antes de las relaciones sexuales.  Espermicidas: son sustancias qumicas que matan o bloquean al esperma y no lo dejan ingresar al cuello del tero y al tero. Vienen en forma de cremas, geles, supositorios, espuma o comprimidos. No es necesario tener receta mdica. Se insertan en la vagina con un aplicador antes de tener relaciones sexuales. El proceso debe repetirse cada vez que   tiene relaciones sexuales. ANTICONCEPTIVOS INTRAUTERINOS  Dispositivo intrauterino (DIU) es un dispositivo en forma de T que se coloca en el tero durante el perodo menstrual, para evitar el embarazo. Hay dos tipos:  DIU de cobre: este tipo de DIU est recubierto con un alambre de cobre y se inserta dentro del tero. El cobre hace que el tero y las trompas de Falopio produzcan un liquido que destruye los espermatozoides. Puede permanecer colocado durante 10 aos.  DIU con hormona: este tipo de DIU contiene la hormona progestina (progesterona sinttica). La hormona espesa el moco cervical y evita que los espermatozoides ingresen al tero y tambin afina la membrana que cubre el tero para evitar la implantacin del vulo fertilizado. La hormona debilita o destruye los espermatozoides que ingresan al tero. Puede permanecer en el lugar durante 3-5 aos, segn el tipo de DIU que se utilice. MTODOS ANTICONCEPTIVOS PERMANENTES  Ligadura de trompas en la mujer: se realiza sellando, atando u obstruyendo quirrgicamente las trompas de Falopio lo que impide que el vulo descienda hacia el tero.  Esterilizacin histeroscpica: Implica la colocacin de un pequeo espiral o la insercin en cada trompa de Falopio. El mdico  utiliza una tcnica llamada histeroscopa para realizar este procedimiento. El dispositivo produce la formacin de tejido cicatrizal. Esto da como resultado una obstruccin permanente de las trompas de Falopio, de modo que la esperma no pueda fertilizar el vulo. Demora alrededor de 3 meses despus del procedimiento hasta que el conducto se obstruye. Tendr que usar otro mtodo anticonceptivo durante al menos 3 meses.  Esterilizacin masculina: se realiza ligando los conductos por los que pasan los espermatozoides (vasectoma).Esto impide que el esperma ingrese a la vagina durante el acto sexual. Luego del procedimiento, el hombre puede eyacular lquido (semen). MTODOS DE PLANIFICACIN NATURAL  Planificacin familiar natural: consiste en no tener relaciones sexuales o usar un mtodo de barrera (condn, diafragma, capuchn cervical) en los das que la mujer podra quedar embarazada.  Mtodo de calendario: consiste en el seguimiento de la duracin de cada ciclo menstrual y la identificacin de los perodos frtiles.  Mtodo de ovulacin: consiste en evitar las relaciones sexuales durante la ovulacin.  Mtodo sintotrmico: consiste en evitar las relaciones sexuales en la poca en la que se est ovulando, utilizando un termmetro y tendiendo en cuenta los sntomas de la ovulacin.  Mtodo postovulacin: consiste en planificar las relaciones sexuales para despus de haber ovulado. Independientemente del tipo o mtodo anticonceptivo que usted elija, es importante que use condones para protegerse contra las infecciones de transmisin sexual (ETS). Hable con su mdico con respecto a qu mtodo anticonceptivo es el ms apropiado para usted. Document Released: 09/23/2005 Document Revised: 05/26/2013 [employer] Patient Information 2015 [employer]. This information is not intended to replace advice given to you by your health care provider. Make sure you discuss any questions you have with your health care  provider.  Lactancia materna (Breastfeeding) Decidir amamantar es una de las mejores elecciones que puede hacer por usted y su beb. El cambio hormonal durante el embarazo produce el desarrollo del tejido mamario y aumenta la cantidad y el tamao de los conductos galactforos. Estas hormonas tambin permiten que las protenas, los azcares y las grasas de la sangre produzcan la leche materna en las glndulas productoras de leche. Las hormonas impiden que la leche materna sea liberada antes del nacimiento del beb, adems de impulsar el flujo de leche luego del nacimiento. Una vez que ha comenzado a amamantar, pensar en el beb, as como la   succin o el llanto, pueden estimular la liberacin de leche de las glndulas productoras de leche.  LOS BENEFICIOS DE AMAMANTAR Para el beb  La primera leche (calostro) ayuda a mejorar el funcionamiento del sistema digestivo del beb.  La leche tiene anticuerpos que ayudan a prevenir las infecciones en el beb.  El beb tiene una menor incidencia de asma, alergias y del sndrome de muerte sbita del lactante.  Los nutrientes en la leche materna son mejores para el beb que la leche maternizada y estn preparados exclusivamente para cubrir las necesidades del beb.  La leche materna mejora el desarrollo cerebral del beb.  Es menos probable que el beb desarrolle otras enfermedades, como obesidad infantil, asma o diabetes mellitus de tipo 2. Para usted   La lactancia materna favorece el desarrollo de un vnculo muy especial entre la madre y el beb.  Es conveniente. La leche materna siempre est disponible a la temperatura correcta y es econmica.  La lactancia materna ayuda a quemar caloras y a perder el peso ganado durante el embarazo.  Favorece la contraccin del tero al tamao que tena antes del embarazo de manera ms rpida y disminuye el sangrado (loquios) despus del parto.  La lactancia materna contribuye a reducir el riesgo de desarrollar  diabetes mellitus de tipo 2, osteoporosis o cncer de mama o de ovario en el futuro. SIGNOS DE QUE EL BEB EST HAMBRIENTO Primeros signos de hambre  Aumenta su estado de alerta o actividad.  Se estira.  Mueve la cabeza de un lado a otro.  Mueve la cabeza y abre la boca cuando se le toca la mejilla o la comisura de la boca (reflejo de bsqueda).  Aumenta las vocalizaciones, tales como sonidos de succin, se relame los labios, emite arrullos, suspiros, o chirridos.  Mueve la mano hacia la boca.  Se chupa con ganas los dedos o las manos. Signos tardos de hambre  Est agitado.  Llora de manera intermitente. Signos de hambre extrema Los signos de hambre extrema requerirn que lo calme y lo consuele antes de que el beb pueda alimentarse adecuadamente. No espere a que se manifiesten los siguientes signos de hambre extrema para comenzar a amamantar:   Agitacin.  Llanto intenso y fuerte.   Gritos. INFORMACIN BSICA SOBRE LA LACTANCIA MATERNA Iniciacin de la lactancia materna  Encuentre un lugar cmodo para sentarse o acostarse, con un buen respaldo para el cuello y la espalda.  Coloque una almohada o una manta enrollada debajo del beb para acomodarlo a la altura de la mama (si est sentada). Las almohadas para amamantar se han diseado especialmente a fin de servir de apoyo para los brazos y el beb mientras amamanta.  Asegrese de que el abdomen del beb est frente al suyo.  Masajee suavemente la mama. Con las yemas de los dedos, masajee la pared del pecho hacia el pezn en un movimiento circular. Esto estimula el flujo de leche. Es posible que deba continuar este movimiento mientras amamanta si la leche fluye lentamente.  Sostenga la mama con el pulgar por arriba del pezn y los otros 4 dedos por debajo de la mama. Asegrese de que los dedos se encuentren lejos del pezn y de la boca del beb.  Empuje suavemente los labios del beb con el pezn o con el dedo.  Cuando la  boca del beb se abra lo suficiente, acrquelo rpidamente a la mama e introduzca todo el pezn y la zona oscura que lo rodea (areola), tanto como sea posible, dentro   de la boca del beb.  Debe haber ms areola visible por arriba del labio superior del beb que por debajo del labio inferior.  La lengua del beb debe estar entre la enca inferior y la mama.  Asegrese de que la boca del beb est en la posicin correcta alrededor del pezn (prendida). Los labios del beb deben crear un sello sobre la mama y estar doblados hacia afuera (invertidos).  Es comn que el beb succione durante 2 a 3 minutos para que comience el flujo de leche materna. Cmo debe prenderse Es muy importante que le ensee al beb cmo prenderse adecuadamente a la mama. Si el beb no se prende adecuadamente, puede causarle dolor en el pezn y reducir la produccin de leche materna, y hacer que el beb tenga un escaso aumento de peso. Adems, si el beb no se prende adecuadamente al pezn, puede tragar aire durante la alimentacin. Esto puede causarle molestias al beb. Hacer eructar al beb al cambiar de mama puede ayudarlo a liberar el aire. Sin embargo, ensearle al beb cmo prenderse a la mama adecuadamente es la mejor manera de evitar que se sienta molesto por tragar aire mientras se alimenta. Signos de que el beb se ha prendido adecuadamente al pezn:   Tironea o succiona de modo silencioso, sin causarle dolor.  Se escucha que traga cada 3 o 4 succiones.   Hay movimientos musculares por arriba y por delante de sus odos al succionar. Signos de que el beb no se ha prendido adecuadamente al pezn:   Hace ruidos de succin o de chasquido mientras se alimenta.  Siente dolor en el pezn. Si cree que el beb no se prendi correctamente, deslice el dedo en la comisura de la boca y colquelo entre las encas del beb para interrumpir la succin. Intente comenzar a amamantar nuevamente. Signos de lactancia materna  exitosa Signos del beb:   Disminuye gradualmente el nmero de succiones o cesa la succin por completo.  Se duerme.  Relaja el cuerpo.  Retiene una pequea cantidad de leche en la boca.  Se desprende solo del pecho. Signos que presenta usted:  Las mamas han aumentado la firmeza, el peso y el tamao 1 a 3 horas despus de amamantar.  Estn ms blandas inmediatamente despus de amamantar.  Un aumento del volumen de leche, y tambin un cambio en su consistencia y color se producen hacia el quinto da de lactancia materna.  Los pezones no duelen, ni estn agrietados ni sangran. Signos de que su beb recibe la cantidad de leche suficiente  Moja al menos 3 paales en 24 horas. La orina debe ser clara y de color amarillo plido a los 5 das de vida.  Defeca al menos 3 veces en 24 horas a los 5 das de vida. La materia fecal debe ser blanda y amarillenta.  Defeca al menos 3 veces en 24 horas a los 7 das de vida. La materia fecal debe ser grumosa y amarillenta.  No registra una prdida de peso mayor del 10% del peso al nacer durante los primeros 3 das de vida.  Aumenta de peso un promedio de 4 a 7onzas (113 a 198g) por semana despus de los 4 das de vida.  Aumenta de peso, diariamente, de manera uniforme a partir de los 5 das de vida, sin registrar prdida de peso despus de las 2semanas de vida. Despus de alimentarse, es posible que el beb regurgite una pequea cantidad. Esto es frecuente. FRECUENCIA Y DURACIN DE LA LACTANCIA MATERNA   El amamantamiento frecuente la ayudar a producir ms leche y a prevenir problemas de dolor en los pezones e hinchazn en las mamas. Alimente al beb cuando muestre signos de hambre o si siente la necesidad de reducir la congestin de las mamas. Esto se denomina "lactancia a demanda". Evite el uso del chupete mientras trabaja para establecer la lactancia (las primeras 4 a 6 semanas despus del nacimiento del beb). Despus de este perodo, podr  ofrecerle un chupete. Las investigaciones demostraron que el uso del chupete durante el primer ao de vida del beb disminuye el riesgo de desarrollar el sndrome de muerte sbita del lactante (SMSL). Permita que el nio se alimente en cada mama todo lo que desee. Contine amamantando al beb hasta que haya terminado de alimentarse. Cuando el beb se desprende o se queda dormido mientras se est alimentando de la primera mama, ofrzcale la segunda. Debido a que, con frecuencia, los recin nacidos permanecen somnolientos las primeras semanas de vida, es posible que deba despertar al beb para alimentarlo. Los horarios de lactancia varan de un beb a otro. Sin embargo, las siguientes reglas pueden servir como gua para ayudarla a garantizar que el beb se alimenta adecuadamente:  Se puede amamantar a los recin nacidos (bebs de 4 semanas o menos de vida) cada 1 a 3 horas.  No deben transcurrir ms de 3 horas durante el da o 5 horas durante la noche sin que se amamante a los recin nacidos.  Debe amamantar al beb 8 veces como mnimo en un perodo de 24 horas, hasta que comience a introducir slidos en su dieta, a los 6 meses de vida aproximadamente. EXTRACCIN DE LECHE MATERNA La extraccin y el almacenamiento de la leche materna le permiten asegurarse de que el beb se alimente exclusivamente de leche materna, aun en momentos en los que no puede amamantar. Esto tiene especial importancia si debe regresar al trabajo en el perodo en que an est amamantando o si no puede estar presente en los momentos en que el beb debe alimentarse. Su asesor en lactancia puede orientarla sobre cunto tiempo es seguro almacenar leche materna.  El sacaleche es un aparato que le permite extraer leche de la mama a un recipiente estril. Luego, la leche materna extrada puede almacenarse en un refrigerador o congelador. Algunos sacaleches son manuales, mientras que otros son elctricos. Consulte a su asesor en lactancia qu  tipo ser ms conveniente para usted. Los sacaleches se pueden comprar; sin embargo, algunos hospitales y grupos de apoyo a la lactancia materna alquilan sacaleches mensualmente. Un asesor en lactancia puede ensearle cmo extraer leche materna manualmente, en caso de que prefiera no usar un sacaleche.  CMO CUIDAR LAS MAMAS DURANTE LA LACTANCIA MATERNA Los pezones se secan, agrietan y duelen durante la lactancia materna. Las siguientes recomendaciones pueden ayudarla a mantener las mamas humectadas y sanas:  Evite usar jabn en los pezones.  Use un sostn de soporte. Aunque no son esenciales, las camisetas sin mangas o los sostenes especiales para amamantar estn diseados para acceder fcilmente a las mamas, para amamantar sin tener que quitarse todo el sostn o la camiseta. Evite usar sostenes con aro o sostenes muy ajustados.  Seque al aire sus pezones durante 3 a 4minutos despus de amamantar al beb.  Utilice solo apsitos de algodn en el sostn para absorber las prdidas de leche. La prdida de un poco de leche materna entre las tomas es normal.  Utilice lanolina sobre los pezones luego de amamantar. La lanolina ayuda a   mantener la humedad normal de la piel. Si usa lanolina pura, no tiene que lavarse los pezones antes de volver a alimentar al beb. La lanolina pura no es txica para el beb. Adems, puede extraer manualmente algunas gotas de leche materna y masajear suavemente esa leche sobre los pezones, para que la leche se seque al aire. Durante las primeras semanas despus de dar a luz, algunas mujeres pueden experimentar hinchazn en las mamas (congestin mamaria). La congestin puede hacer que sienta las mamas pesadas, calientes y sensibles al tacto. El pico de la congestin ocurre dentro de los 3 a 5 das despus del parto. Las siguientes recomendaciones pueden ayudarla a aliviar la congestin:  Vace por completo las mamas al amamantar o extraer leche. Puede aplicar calor hmedo en  las mamas (en la ducha o con toallas hmedas para manos) antes de amamantar o extraer leche. Esto aumenta la circulacin y ayuda a que la leche fluya. Si el beb no vaca por completo las mamas cuando lo amamanta, extraiga la leche restante despus de que haya finalizado.  Use un sostn ajustado (para amamantar o comn) o una camiseta sin mangas durante 1 o 2 das para indicar al cuerpo que disminuya ligeramente la produccin de leche.  Aplique compresas de hielo sobre las mamas, a menos que le resulte demasiado incmodo.  Asegrese de que el beb est prendido y se encuentre en la posicin correcta mientras lo alimenta. Si la congestin persiste luego de 48 horas o despus de seguir estas recomendaciones, comunquese con su mdico o un asesor en lactancia. RECOMENDACIONES GENERALES PARA EL CUIDADO DE LA SALUD DURANTE LA LACTANCIA MATERNA  Consuma alimentos saludables. Alterne comidas y colaciones, y coma 3 de cada una por da. Dado que lo que come afecta la leche materna, es posible que algunas comidas hagan que su beb se vuelva ms irritable de lo habitual. Evite comer este tipo de alimentos si percibe que afectan de manera negativa al beb.  Beba leche, jugos de fruta y agua para satisfacer su sed (aproximadamente 10 vasos al da).  Descanse con frecuencia, reljese y tome sus vitaminas prenatales para evitar la fatiga, el estrs y la anemia.  Contine con los autocontroles de la mama.  Evite masticar y fumar tabaco.  Evite el consumo de alcohol y drogas. Algunos medicamentos, que pueden ser perjudiciales para el beb, pueden pasar a travs de la leche materna. Es importante que consulte a su mdico antes de tomar cualquier medicamento, incluidos todos los medicamentos recetados y de venta libre, as como los suplementos vitamnicos y herbales. Puede quedar embarazada durante la lactancia. Si desea controlar la natalidad, consulte a su mdico cules son las opciones ms seguras para el  beb. SOLICITE ATENCIN MDICA SI:   Usted siente que quiere dejar de amamantar o se siente frustrada con la lactancia.  Siente dolor en las mamas o en los pezones.  Sus pezones estn agrietados o sangran.  Sus pechos estn irritados, sensibles o calientes.  Tiene un rea hinchada en cualquiera de las mamas.  Siente escalofros o fiebre.  Tiene nuseas o vmitos.  Presenta una secrecin de otro lquido distinto de la leche materna de los pezones.  Sus mamas no se llenan antes de amamantar al beb para el quinto da despus del parto.  Se siente triste y deprimida.  El beb est demasiado somnoliento como para comer bien.  El beb tiene problemas para dormir.  Moja menos de 3 paales en 24 horas.  Defeca menos de 3 veces   en 24 horas.  La piel del beb o la parte blanca de los ojos se vuelven amarillentas.  El beb no ha aumentado de peso a los 5 das de vida. SOLICITE ATENCIN MDICA DE INMEDIATO SI:   El beb est muy cansado (letargo) y no se quiere despertar para comer.  Le sube la fiebre sin causa. Document Released: 09/23/2005 Document Revised: 09/28/2013 [employer] Patient Information 2015 [employer]. This information is not intended to replace advice given to you by your health care provider. Make sure you discuss any questions you have with your health care provider.  

## 2015-05-01 NOTE — Progress Notes (Signed)
Subjective:  Jeanette [name] is a 39 y.o. G4P3003 at [redacted]w[redacted]d being seen today for ongoing prenatal care.  Patient reports no complaints.  Contractions: Not present.  Vag. Bleeding: None. Movement: Present. Denies leaking of fluid.   The following portions of the patient's history were reviewed and updated as appropriate: allergies, current medications, past family history, past medical history, past social history, past surgical history and problem list.   Objective:   Filed Vitals:   05/01/15 1010  BP: 117/81  Pulse: 88  Weight: 250 lb 4.8 oz (113.535 kg)    Fetal Status: Fetal Heart Rate (bpm): NST   Movement: Present     General:  Alert, oriented and cooperative. Patient is in no acute distress.  Skin: Skin is warm and dry. No rash noted.   Cardiovascular: Normal heart rate noted  Respiratory: Normal respiratory effort, no problems with respiration noted  Abdomen: Soft, gravid, appropriate for gestational age. Pain/Pressure: Present     Vaginal: Vag. Bleeding: None.       Cervix: Exam revealed      1.5/60/-2  Extremities: Normal range of motion.  Edema: None  Mental Status: Normal mood and affect. Normal behavior. Normal judgment and thought content.   Urinalysis: Urine Protein: Negative Urine Glucose: Negative NST reviewed and reactive. U/S for growth today 6 lb 2 oz, vtx, normal fluid FBS 57-81 2 hour pp 82-144 Assessment and Plan:  Pregnancy: G4P3003 at [redacted]w[redacted]d  1. Hypertension complicating pregnancy, third trimester  - Fetal nonstress test  2. Pre-existing diabetes mellitus affecting pregnancy in third trimester, antepartum Schedule IOL at 39 wks BS are well controlled--continue Glyburide. - Fetal nonstress test  3. Supervision of high-risk pregnancy, third trimester Continue routine prenatal care.  - Culture, beta strep (group b only) - GC/Chlamydia Probe Amp  4. Previous Cesarean Section  -TOLAC consent signed today   Term labor symptoms and general  obstetric precautions including but not limited to vaginal bleeding, contractions, leaking of fluid and fetal movement were reviewed in detail with the patient. Please refer to After Visit Summary for other counseling recommendations.  Return in about 4 days (around 05/05/2015) for 2x/wk as scheduled.   Reva Bores, MD

## 2015-05-02 LAB — GC/CHLAMYDIA PROBE AMP
CT Probe RNA: NEGATIVE
GC Probe RNA: NEGATIVE

## 2015-05-03 LAB — CULTURE, BETA STREP (GROUP B ONLY)

## 2015-05-04 ENCOUNTER — Encounter: Payer: Self-pay | Admitting: Family Medicine

## 2015-05-04 DIAGNOSIS — O99820 Streptococcus B carrier state complicating pregnancy: Secondary | ICD-10-CM | POA: Insufficient documentation

## 2015-05-05 ENCOUNTER — Encounter: Payer: Self-pay | Admitting: *Deleted

## 2015-05-05 ENCOUNTER — Ambulatory Visit (INDEPENDENT_AMBULATORY_CARE_PROVIDER_SITE_OTHER): Payer: Self-pay | Admitting: *Deleted

## 2015-05-05 VITALS — BP 123/78 | HR 90

## 2015-05-05 DIAGNOSIS — O24119 Pre-existing diabetes mellitus, type 2, in pregnancy, unspecified trimester: Secondary | ICD-10-CM

## 2015-05-05 DIAGNOSIS — O163 Unspecified maternal hypertension, third trimester: Secondary | ICD-10-CM

## 2015-05-05 NOTE — Progress Notes (Signed)
NST performed today was reviewed and was found to be reactive.  Continue recommended antenatal testing and prenatal care.  

## 2015-05-08 ENCOUNTER — Ambulatory Visit (INDEPENDENT_AMBULATORY_CARE_PROVIDER_SITE_OTHER): Payer: Self-pay | Admitting: Obstetrics & Gynecology

## 2015-05-08 VITALS — BP 113/82 | HR 90 | Wt 253.2 lb

## 2015-05-08 DIAGNOSIS — O163 Unspecified maternal hypertension, third trimester: Secondary | ICD-10-CM

## 2015-05-08 DIAGNOSIS — O24119 Pre-existing diabetes mellitus, type 2, in pregnancy, unspecified trimester: Secondary | ICD-10-CM

## 2015-05-08 DIAGNOSIS — O99820 Streptococcus B carrier state complicating pregnancy: Secondary | ICD-10-CM

## 2015-05-08 DIAGNOSIS — O24113 Pre-existing diabetes mellitus, type 2, in pregnancy, third trimester: Secondary | ICD-10-CM

## 2015-05-08 DIAGNOSIS — Z22330 Carrier of Group B streptococcus: Secondary | ICD-10-CM

## 2015-05-08 LAB — FETAL NONSTRESS TEST

## 2015-05-08 LAB — POCT URINALYSIS DIP (DEVICE)
Bilirubin Urine: NEGATIVE
GLUCOSE, UA: NEGATIVE mg/dL
Ketones, ur: NEGATIVE mg/dL
LEUKOCYTES UA: NEGATIVE
Nitrite: NEGATIVE
PROTEIN: NEGATIVE mg/dL
SPECIFIC GRAVITY, URINE: 1.01 (ref 1.005–1.030)
Urobilinogen, UA: 0.2 mg/dL (ref 0.0–1.0)
pH: 6.5 (ref 5.0–8.0)

## 2015-05-08 NOTE — Progress Notes (Signed)
Subjective:  Jeanette [name] is a 39 y.o. G4P3003 at [redacted]w[redacted]d being seen today for ongoing prenatal care.  Patient reports no complaints. Patient is Spanish-speaking only, Spanish interpreter present for this encounter.  Contractions: Irregular.  Vag. Bleeding: None. Movement: Present. Denies leaking of fluid.   The following portions of the patient's history were reviewed and updated as appropriate: allergies, current medications, past family history, past medical history, past social history, past surgical history and problem list.   Objective:   Filed Vitals:   05/08/15 0914  BP: 113/82  Pulse: 90  Weight: 253 lb 3.2 oz (114.851 kg)    Fetal Status: Fetal Heart Rate (bpm): NST Fundal Height: 40 cm Movement: Present     General:  Alert, oriented and cooperative. Patient is in no acute distress.  Skin: Skin is warm and dry. No rash noted.   Cardiovascular: Normal heart rate noted  Respiratory: Normal respiratory effort, no problems with respiration noted  Abdomen: Soft, gravid, appropriate for gestational age. Pain/Pressure: Present     Vaginal: Vag. Bleeding: None.       Cervix: Not evaluated        Extremities: Normal range of motion.     Mental Status: Normal mood and affect. Normal behavior. Normal judgment and thought content.   Urinalysis: Urine Protein: Negative Urine Glucose: Negative  CBGs: Normal fasting 64-89//2 hr PP  B 97-136 ; L 101-144; 105-151 NST performed today was reviewed and was found to be reactive.  AFI normal at 15.8 cm.  Continue recommended antenatal testing and prenatal care. 05/01/15 EFW 6 lb 2 oz/31%,  AFI 11.47 cm, cephalic  Assessment and Plan:  Pregnancy: G4P3003 at [redacted]w[redacted]d  1. Hypertension complicating pregnancy, third trimester 2. Pre-existing type 2 diabetes affecting pregnancy, antepartum IOL/TOLAC scheduled on 05/10/15.   BP stable.  Continue diet, exercise and Glyburide 5/7.5 mg for now.  3. Group B Streptococcus carrier, +RV culture,  currently pregnant Will treat in labor  Term labor symptoms and general obstetric precautions including but not limited to vaginal bleeding, contractions, leaking of fluid and fetal movement were reviewed in detail with the patient. Please refer to After Visit Summary for other counseling recommendations.  Return in about 6 weeks (around 06/16/2015) for PP visit and 2 hr GTT as scheduled.   Tereso Newcomer, MD

## 2015-05-08 NOTE — Patient Instructions (Signed)
Regrese a la clinica cuando tenga su cita. Si tiene problemas o preguntas, llama a la clinica o vaya a la sala de emergencia al [facility].    

## 2015-05-08 NOTE — Progress Notes (Signed)
IOL scheduled 8/3.

## 2015-05-08 NOTE — Addendum Note (Signed)
Addended by: Jill Side on: 05/08/2015 01:53 PM   Modules accepted: Orders

## 2015-05-09 ENCOUNTER — Encounter (HOSPITAL_COMMUNITY): Payer: Self-pay | Admitting: *Deleted

## 2015-05-09 ENCOUNTER — Telehealth (HOSPITAL_COMMUNITY): Payer: Self-pay | Admitting: *Deleted

## 2015-05-09 NOTE — Telephone Encounter (Signed)
Preadmission screen  

## 2015-05-09 NOTE — Telephone Encounter (Signed)
Preadmission screen interpreter number [phone]

## 2015-05-10 ENCOUNTER — Inpatient Hospital Stay (HOSPITAL_COMMUNITY): Payer: Medicaid Other | Admitting: Anesthesiology

## 2015-05-10 ENCOUNTER — Inpatient Hospital Stay (HOSPITAL_COMMUNITY)
Admission: RE | Admit: 2015-05-10 | Discharge: 2015-05-12 | DRG: 774 | Disposition: A | Discharge status: Home / Self Care | Payer: Medicaid Other | Source: Ambulatory Visit | Attending: Obstetrics and Gynecology | Admitting: Obstetrics and Gynecology

## 2015-05-10 ENCOUNTER — Encounter (HOSPITAL_COMMUNITY): Payer: Self-pay

## 2015-05-10 VITALS — BP 124/78 | HR 86 | Temp 98.4°F | Resp 18 | Ht 63.0 in | Wt 253.0 lb

## 2015-05-10 DIAGNOSIS — O2412 Pre-existing diabetes mellitus, type 2, in childbirth: Secondary | ICD-10-CM | POA: Diagnosis present

## 2015-05-10 DIAGNOSIS — Z3A39 39 weeks gestation of pregnancy: Secondary | ICD-10-CM | POA: Diagnosis present

## 2015-05-10 DIAGNOSIS — Z8249 Family history of ischemic heart disease and other diseases of the circulatory system: Secondary | ICD-10-CM

## 2015-05-10 DIAGNOSIS — O99214 Obesity complicating childbirth: Secondary | ICD-10-CM | POA: Diagnosis present

## 2015-05-10 DIAGNOSIS — O3421 Maternal care for scar from previous cesarean delivery: Secondary | ICD-10-CM | POA: Diagnosis present

## 2015-05-10 DIAGNOSIS — O99820 Streptococcus B carrier state complicating pregnancy: Secondary | ICD-10-CM

## 2015-05-10 DIAGNOSIS — O09523 Supervision of elderly multigravida, third trimester: Secondary | ICD-10-CM

## 2015-05-10 DIAGNOSIS — O0993 Supervision of high risk pregnancy, unspecified, third trimester: Secondary | ICD-10-CM

## 2015-05-10 DIAGNOSIS — Z8279 Family history of other congenital malformations, deformations and chromosomal abnormalities: Secondary | ICD-10-CM

## 2015-05-10 DIAGNOSIS — Z6841 Body Mass Index (BMI) 40.0 and over, adult: Secondary | ICD-10-CM | POA: Diagnosis not present

## 2015-05-10 DIAGNOSIS — Z833 Family history of diabetes mellitus: Secondary | ICD-10-CM

## 2015-05-10 DIAGNOSIS — O1092 Unspecified pre-existing hypertension complicating childbirth: Principal | ICD-10-CM | POA: Diagnosis present

## 2015-05-10 DIAGNOSIS — Z3490 Encounter for supervision of normal pregnancy, unspecified, unspecified trimester: Secondary | ICD-10-CM

## 2015-05-10 DIAGNOSIS — O99824 Streptococcus B carrier state complicating childbirth: Secondary | ICD-10-CM | POA: Diagnosis present

## 2015-05-10 LAB — COMPREHENSIVE METABOLIC PANEL
ALK PHOS: 162 U/L — AB (ref 38–126)
ALT: 13 U/L — ABNORMAL LOW (ref 14–54)
ANION GAP: 4 — AB (ref 5–15)
AST: 20 U/L (ref 15–41)
Albumin: 2.7 g/dL — ABNORMAL LOW (ref 3.5–5.0)
BUN: 11 mg/dL (ref 6–20)
CALCIUM: 8.6 mg/dL — AB (ref 8.9–10.3)
CO2: 22 mmol/L (ref 22–32)
Chloride: 108 mmol/L (ref 101–111)
Creatinine, Ser: 0.61 mg/dL (ref 0.44–1.00)
GFR calc non Af Amer: >60 mL/min (ref >60)
Glucose, Bld: 85 mg/dL (ref 65–99)
POTASSIUM: 4 mmol/L (ref 3.5–5.1)
Sodium: 134 mmol/L — ABNORMAL LOW (ref 135–145)
TOTAL PROTEIN: 6.5 g/dL (ref 6.5–8.1)
Total Bilirubin: 0.4 mg/dL (ref 0.3–1.2)

## 2015-05-10 LAB — URINALYSIS, ROUTINE W REFLEX MICROSCOPIC
BILIRUBIN URINE: NEGATIVE
Glucose, UA: NEGATIVE mg/dL
Ketones, ur: NEGATIVE mg/dL
Nitrite: NEGATIVE
Protein, ur: NEGATIVE mg/dL
Specific Gravity, Urine: 1.02 (ref 1.005–1.030)
Urobilinogen, UA: 0.2 mg/dL (ref 0.0–1.0)
pH: 6 (ref 5.0–8.0)

## 2015-05-10 LAB — GLUCOSE, CAPILLARY: Glucose-Capillary: 69 mg/dL (ref 65–99)

## 2015-05-10 LAB — TYPE AND SCREEN
ABO/RH(D): A POS
ANTIBODY SCREEN: NEGATIVE

## 2015-05-10 LAB — CBC
HCT: 35.4 % — ABNORMAL LOW (ref 36.0–46.0)
HCT: 37.4 % (ref 36.0–46.0)
HEMOGLOBIN: 11.6 g/dL — AB (ref 12.0–15.0)
Hemoglobin: 12.1 g/dL (ref 12.0–15.0)
MCH: 25.2 pg — AB (ref 26.0–34.0)
MCH: 25.6 pg — AB (ref 26.0–34.0)
MCHC: 32.4 g/dL (ref 30.0–36.0)
MCHC: 32.8 g/dL (ref 30.0–36.0)
MCV: 77.8 fL — ABNORMAL LOW (ref 78.0–100.0)
MCV: 78 fL (ref 78.0–100.0)
Platelets: 295 10*3/uL (ref 150–400)
Platelets: 299 10*3/uL (ref 150–400)
RBC: 4.54 MIL/uL (ref 3.87–5.11)
RBC: 4.81 MIL/uL (ref 3.87–5.11)
RDW: 17 % — ABNORMAL HIGH (ref 11.5–15.5)
RDW: 17.4 % — ABNORMAL HIGH (ref 11.5–15.5)
WBC: 10 10*3/uL (ref 4.0–10.5)
WBC: 11.3 10*3/uL — AB (ref 4.0–10.5)

## 2015-05-10 LAB — URINE MICROSCOPIC-ADD ON

## 2015-05-10 LAB — ABO/RH: ABO/RH(D): A POS

## 2015-05-10 LAB — PROTEIN / CREATININE RATIO, URINE
Creatinine, Urine: 62 mg/dL
Protein Creatinine Ratio: 0.23 mg/mg{Cre} — ABNORMAL HIGH (ref 0.00–0.15)
Total Protein, Urine: 14 mg/dL

## 2015-05-10 LAB — RPR: RPR Ser Ql: NONREACTIVE

## 2015-05-10 MED ORDER — PENICILLIN G POTASSIUM 5000000 UNITS IJ SOLR
2.5000 10*6.[IU] | INTRAMUSCULAR | Status: DC
  Administered 2015-05-10 – 2015-05-11 (×4): 2.5 10*6.[IU] via INTRAVENOUS
  Filled 2015-05-10 (×8): qty 2.5

## 2015-05-10 MED ORDER — TERBUTALINE SULFATE 1 MG/ML IJ SOLN
0.2500 mg | Freq: Once | INTRAMUSCULAR | Status: AC | PRN
Start: 2015-05-10 — End: 2015-05-10

## 2015-05-10 MED ORDER — FENTANYL 2.5 MCG/ML BUPIVACAINE 1/10 % EPIDURAL INFUSION (WH - ANES)
14.0000 mL/h | INTRAMUSCULAR | Status: DC | PRN
  Administered 2015-05-11: 14 mL/h via EPIDURAL
  Filled 2015-05-10: qty 125

## 2015-05-10 MED ORDER — OXYTOCIN 40 UNITS IN LACTATED RINGERS INFUSION - SIMPLE MED
1.0000 m[IU]/min | INTRAVENOUS | Status: DC
  Administered 2015-05-10: 2 m[IU]/min via INTRAVENOUS

## 2015-05-10 MED ORDER — OXYTOCIN 40 UNITS IN LACTATED RINGERS INFUSION - SIMPLE MED
62.5000 mL/h | INTRAVENOUS | Status: DC
  Filled 2015-05-10: qty 1000

## 2015-05-10 MED ORDER — FLEET ENEMA 7-19 GM/118ML RE ENEM: 1.0000 | ENEMA | RECTAL | Status: DC | PRN

## 2015-05-10 MED ORDER — HYDRALAZINE HCL 20 MG/ML IJ SOLN: 10.0000 mg | Freq: Once | INTRAMUSCULAR | Status: AC | PRN

## 2015-05-10 MED ORDER — FENTANYL CITRATE (PF) 100 MCG/2ML IJ SOLN
100.0000 ug | INTRAMUSCULAR | Status: DC | PRN
  Administered 2015-05-10: 100 ug via INTRAVENOUS
  Filled 2015-05-10: qty 2

## 2015-05-10 MED ORDER — ACETAMINOPHEN 325 MG PO TABS: 650.0000 mg | ORAL_TABLET | ORAL | Status: DC | PRN

## 2015-05-10 MED ORDER — LACTATED RINGERS IV SOLN
INTRAVENOUS | Status: DC
  Administered 2015-05-10 – 2015-05-11 (×3): via INTRAVENOUS

## 2015-05-10 MED ORDER — ONDANSETRON HCL 4 MG/2ML IJ SOLN
4.0000 mg | Freq: Four times a day (QID) | INTRAMUSCULAR | Status: DC | PRN
Start: 2015-05-10 — End: 2015-05-11

## 2015-05-10 MED ORDER — LABETALOL HCL 5 MG/ML IV SOLN: 20.0000 mg | INTRAVENOUS | Status: DC | PRN

## 2015-05-10 MED ORDER — OXYCODONE-ACETAMINOPHEN 5-325 MG PO TABS: 2.0000 | ORAL_TABLET | ORAL | Status: DC | PRN

## 2015-05-10 MED ORDER — DIPHENHYDRAMINE HCL 50 MG/ML IJ SOLN: 12.5000 mg | INTRAMUSCULAR | Status: DC | PRN

## 2015-05-10 MED ORDER — CITRIC ACID-SODIUM CITRATE 334-500 MG/5ML PO SOLN: 30.0000 mL | ORAL | Status: DC | PRN

## 2015-05-10 MED ORDER — OXYCODONE-ACETAMINOPHEN 5-325 MG PO TABS: 1.0000 | ORAL_TABLET | ORAL | Status: DC | PRN

## 2015-05-10 MED ORDER — EPHEDRINE 5 MG/ML INJ
10.0000 mg | INTRAVENOUS | Status: DC | PRN
  Filled 2015-05-10: qty 2

## 2015-05-10 MED ORDER — LACTATED RINGERS IV SOLN
500.0000 mL | INTRAVENOUS | Status: DC | PRN
  Administered 2015-05-10: 500 mL via INTRAVENOUS

## 2015-05-10 MED ORDER — PHENYLEPHRINE 40 MCG/ML (10ML) SYRINGE FOR IV PUSH (FOR BLOOD PRESSURE SUPPORT)
80.0000 ug | PREFILLED_SYRINGE | INTRAVENOUS | Status: DC | PRN
  Filled 2015-05-10: qty 20
  Filled 2015-05-10: qty 2

## 2015-05-10 MED ORDER — OXYTOCIN BOLUS FROM INFUSION
500.0000 mL | INTRAVENOUS | Status: DC
  Administered 2015-05-11: 500 mL via INTRAVENOUS

## 2015-05-10 MED ORDER — LIDOCAINE HCL (PF) 1 % IJ SOLN
30.0000 mL | INTRAMUSCULAR | Status: DC | PRN
  Filled 2015-05-10: qty 30

## 2015-05-10 MED ORDER — PENICILLIN G POTASSIUM 5000000 UNITS IJ SOLR
5.0000 10*6.[IU] | Freq: Once | INTRAVENOUS | Status: AC
  Administered 2015-05-10: 5 10*6.[IU] via INTRAVENOUS
  Filled 2015-05-10: qty 5

## 2015-05-10 NOTE — Progress Notes (Signed)
Labor Progress Note  S: Comfortable, no complaints.  O:  BP 137/69 mmHg  Pulse 75  Temp(Src) 98.4 F (36.9 C) (Oral)  Resp 18  Ht  (1.6 m)  Wt 114.76 kg (253 lb)  BMI 44.83 kg/m2  LMP 07/15/2014 Cat 1 tracing, fhr baseline around 135, moderate variability, accelerations present, variable decelerations  CVE: Dilation: 5 Effacement (%): 70 Cervical Position: Posterior Station: -3 Presentation: Vertex Exam by:: dr Earlene Plater   A&P: 39 y.o. [id number] [redacted]w[redacted]d admitted for IOL.  Continue to monitor.  Blood sugars have been stable.  Pressures have been normotensive with only two, non-severe episodes of HTN.  # Continue augmentation with pitocin # Consider AROM  Marcy Siren, D.O. 05/10/2015, 8:31 PM PGY-1, [facility]

## 2015-05-10 NOTE — Anesthesia Preprocedure Evaluation (Signed)
Anesthesia Evaluation  Patient identified by MRN, date of birth, ID band Patient awake    Reviewed: Allergy & Precautions, H&P , NPO status , Patient's Chart, lab work & pertinent test results  Airway Mallampati: II  TM Distance: >3 FB Neck ROM: full    Dental no notable dental hx.    Pulmonary neg pulmonary ROS,    Pulmonary exam normal       Cardiovascular Normal cardiovascular exam    Neuro/Psych    GI/Hepatic negative GI ROS, Neg liver ROS,   Endo/Other  diabetes, Gestational, Oral Hypoglycemic AgentsMorbid obesity  Renal/GU negative Renal ROS     Musculoskeletal   Abdominal (+) + obese,   Peds  Hematology negative hematology ROS (+)   Anesthesia Other Findings   Reproductive/Obstetrics (+) Pregnancy                             Anesthesia Physical Anesthesia Plan  ASA: III  Anesthesia Plan: Epidural   Post-op Pain Management:    Induction:   Airway Management Planned:   Additional Equipment:   Intra-op Plan:   Post-operative Plan:   Informed Consent: I have reviewed the patients History and Physical, chart, labs and discussed the procedure including the risks, benefits and alternatives for the proposed anesthesia with the patient or authorized representative who has indicated his/her understanding and acceptance.     Plan Discussed with:   Anesthesia Plan Comments:         Anesthesia Quick Evaluation

## 2015-05-10 NOTE — Progress Notes (Signed)
Labor Progress Note    S: Late entry. Reassessed pt with assistance from interpreter at approximately 2:40pm.  Patient states she is not currently feeling contractions or pain, but does feel pressure in her lower abdomen and pelvis.    O:  BP 122/67 mmHg  Pulse 78  Temp(Src) 98.5 F (36.9 C)  Resp 16  Ht  (1.6 m)  Wt 114.76 kg (253 lb)  BMI 44.83 kg/m2  LMP 07/15/2014 Cat 1 tracing, uterine contractions appear minimal at this time CVE: Cervical exam deferred at this time  A&P: 39 y.o. [id number] [redacted]w[redacted]d here for scheduled IOL. # Continue with expectant management and augmentation of labor with pitocin    Retta Mac, Med Student 3:13 PM

## 2015-05-10 NOTE — Progress Notes (Signed)
I assisted Diana RN with some questions, by Viria Alvarez Spanish Interpreter. °

## 2015-05-10 NOTE — Progress Notes (Signed)
Pt seen after rounds for cervical reassessment and evaluation for AROM/IUPC placement. She is currently experiencing moderate pain w/ contractions.  CE: 3/50/-3  Pt AROM'd and IUPC placed for pit titration.Will begin fent 100 q hr prn.  I have seen and examined this patient and I agree with the above. SHAW, KIMBERLY 11:12 PM 05/10/2015

## 2015-05-10 NOTE — H&P (Signed)
LABOR ADMISSION HISTORY AND PHYSICAL  Jeanette [name] is a 39 y.o. female [phone] with  IUP at [redacted]w[redacted]d by ultrasound presenting for scheduled IOL/TOLAC.  She reports +FMs, No LOF, no VB, no blurry vision, headaches or peripheral edema, and RUQ pain. PMH significant for Type A/B diabetes, poorly controlled on glyburide and metformin and chronic HTN, on no medications and 81mg  ASA.  Was followed in [facility] given hx of prior C/S for baby with hydrocephalus and macrosomia.  Speaks Spanish only and needs interpreter.  After delivery she plans on breast feeding. She requests BTL if C/S or IUD for birth control.   Dating: By 14 week Anatomy Ultrasound --->  Estimated Date of Delivery: 05/17/15  Sono:  -11/22/14 14 week anatomy ultrasound: gestational age @[redacted]w[redacted]d  by U/S with EDD 05/17/15; no gross fetal anomalies, breech presentation -05/01/15 follow up U/S: Presentation is cephalic, placenta is anterior, above cervical os, EFW is at 31st%  Other U/S this pregnancy: -04/17/15 BPP W/O Non stress -04/03/15 follow up U/S -03/27/15 limited U/S -03/07/15 follow up U/S -02/06/15 follow up U/S -01/09/15 follow up U/S -12/27/14 fetal echo (no abnormalities noted, technically difficult study 2/2 habitus) -12/12/14 follow up U/S  Prenatal History/Complications:  Past Medical History: Past Medical History  Diagnosis Date  . Migraine headache 06/13/2011  . Hypertension     only with pregnancy  . Pregnancy induced hypertension   . Diabetes mellitus without complication   . Gestational diabetes     glyburide    Past Surgical History: Past Surgical History  Procedure Laterality Date  . Cesarean section  2004    Obstetrical History: OB History    Gravida Para Term Preterm AB TAB SAB Ectopic Multiple Living   4 3 3  0 0 0 0 0 0 3      Social History: History   Social History  . Marital Status: Married    Spouse Name: N/A  . Number of Children: N/A  . Years of Education: N/A   Social History Main  Topics  . Smoking status: Never Smoker   . Smokeless tobacco: Never Used  . Alcohol Use: No  . Drug Use: No  . Sexual Activity: Yes    Birth Control/ Protection: None   Other Topics Concern  . None   Social History Narrative    Family History: Family History  Problem Relation Age of Onset  . Diabetes Father   . Hypertension Mother   . Diabetes Mother   . Birth defects Daughter     hydrocephaly  . Diabetes Paternal Grandmother   . Anesthesia problems Neg Hx   . Hypotension Neg Hx   . Malignant hyperthermia Neg Hx   . Pseudochol deficiency Neg Hx   . Diabetes Paternal Uncle     Allergies: No Known Allergies  Prescriptions prior to admission  Medication Sig Dispense Refill Last Dose  . aspirin 81 MG chewable tablet Chew 1 tablet (81 mg total) by mouth daily. 180 tablet 3 Taking  . glyBURIDE (DIABETA) 5 MG tablet Take 1-1.5 tablets (5-7.5 mg total) by mouth 2 (two) times daily with a meal. One tablet in the morning and 1.5 tablets at night. 75 tablet 3 Taking  . metFORMIN (GLUCOPHAGE) 1000 MG tablet Take 1 tablet (1,000 mg total) by mouth 2 (two) times daily with a meal. 60 tablet 3 Taking  . prenatal vitamin w/FE, FA (PRENATAL 1 + 1) 27-1 MG TABS tablet Take 1 tablet by mouth daily at 12 noon. 30 each 11 Taking  Review of Systems  All systems reviewed and negative except as stated in HPI  Blood pressure 128/82, pulse 77, temperature 98.5 F (36.9 C), resp. rate 16, height 5\' 3"  (1.6 m), weight 114.76 kg (253 lb), last menstrual period 07/15/2014, unknown if currently breastfeeding. General appearance: alert, cooperative and no distress , pleasant Lungs: clear to auscultation bilaterally Heart: regular rate and rhythm, no murmur, distant heart sounds 2/2 body habitus Abdomen: soft, non-tender.  Gravid uterus consistent with dates Pelvic: Deferred Presentation: cephalic Fetal monitoringBaseline: 135 bpm, Variability: Good {> 6 bpm), Accelerations: Reactive and  Decelerations: Variable: mild Uterine activity: irregular contractions Dilation: 3 Effacement (%): 60 Station: -2 Exam by:: lee  Prenatal labs: ABO, Rh: A/Positive/-- (02/08 0000) Antibody: Negative (02/08 0000) Rubella:   RPR: NON REAC (05/09 1307)  HBsAg: Negative (02/08 0000)  HIV: NONREACTIVE (05/09 1307)  GBS: Positive (07/25 0000)  1 hr Glucola: Positive, blood sugars followed during pregnancy Genetic screening: Negative QUAD screen after ultrasound date correction  Anatomy US: No gross abnormalities  Prenatal Transfer Tool  Maternal Diabetes: Yes:  Diabetes Type:  Pre-pregnancy Genetic Screening: Normal Maternal Ultrasounds/Referrals: Normal Fetal Ultrasounds or other Referrals:  Fetal echo Maternal Substance Abuse:  No Significant Maternal Medications:  Meds include: Other:  Glyburide 5mg /7.5mg  (morning and night), Metformin 1000mg  BID, 81mg  ASA Significant Maternal Lab Results: Lab values include: Group B Strep positive  No results found for this or any previous visit (from the past 24 hour(s)).  Patient Active Problem List   Diagnosis Date Noted  . Pregnancy 05/10/2015  . Group B Streptococcus carrier, +RV culture, currently pregnant 05/04/2015  . Obesity complicating pregnancy   . Language barrier affecting health care 12/12/2014  . Supervision of high-risk pregnancy 12/12/2014  . Pre-existing type 2 diabetes affecting pregnancy, antepartum   . Advanced maternal age in multigravida   . Family history of congenital anomalies   . Hydrocephalic fetus causing disproportion, delivered 06/27/2011  . Previous cesarean delivery affecting pregnancy, antepartum 06/20/2011  . Migraine headache 06/13/2011  . Hypertension complicating pregnancy 05/16/2011  . Pica 05/14/2011    Assessment: Jeanette [name] is a 39 y.o. G4P3003 at [redacted]w[redacted]d here for scheduled IOL. #Labor: IOL, currently on pitocin.  Will increase augmentation with pitocin as tolerated #Pain: Epidural  upon request #FWB: Category 1 strip #ID:  GBS Prophylaxis with penicillin initiated 8/3 @0917  #MOF: Breastfeeding #MOC: BTL or IUD #Circ:  Unknown  The above information was documented with assistance from Retta Mac, MS3.   Retta Mac 05/10/2015, 8:57 AM     OB FELLOW HISTORY AND PHYSICAL ATTESTATION  I have seen and examined this patient; I agree with above documentation in the medical student's note.  Jeanette [name] is a 39 y.o. [phone] @ 39+0 here for IOL 2/2 pre-gestational diabetes and chronic hypertension.  PE: BP 120/76 mmHg  Pulse 78  Temp(Src) 98.4 F (36.9 C) (Oral)  Resp 18  Ht 5\' 3"  (1.6 m)  Wt 253 lb (114.76 kg)  BMI 44.83 kg/m2  LMP 07/15/2014 Gen: calm comfortable, NAD Resp: normal effort, no distress Abd: gravid  ROS, labs, PMH reviewed  Plan: Admit to L&D. Consented for VBAC; last pregnancy was successful vbac so anticipate successful vaginal delivery. EFW at 35 wks was ~25%. For cHTN will check set of preeclampsia labs; currently normotensive and denying symptoms of preeclampsia. For pre-gestational HTN will monitor f/s glucose q4 hours in latent labor and q2 hours in active labor. Will augment with pitocin; no cytotec given hx prior c/s. Will  give PCN for GBS positive.   Silvano Bilis 05/10/2015, 8:39 PM

## 2015-05-11 ENCOUNTER — Encounter (HOSPITAL_COMMUNITY): Payer: Self-pay

## 2015-05-11 DIAGNOSIS — O99824 Streptococcus B carrier state complicating childbirth: Secondary | ICD-10-CM

## 2015-05-11 DIAGNOSIS — O2412 Pre-existing diabetes mellitus, type 2, in childbirth: Secondary | ICD-10-CM

## 2015-05-11 DIAGNOSIS — Z3A39 39 weeks gestation of pregnancy: Secondary | ICD-10-CM

## 2015-05-11 DIAGNOSIS — O133 Gestational [pregnancy-induced] hypertension without significant proteinuria, third trimester: Secondary | ICD-10-CM

## 2015-05-11 DIAGNOSIS — O99214 Obesity complicating childbirth: Secondary | ICD-10-CM

## 2015-05-11 DIAGNOSIS — E119 Type 2 diabetes mellitus without complications: Secondary | ICD-10-CM

## 2015-05-11 DIAGNOSIS — O09523 Supervision of elderly multigravida, third trimester: Secondary | ICD-10-CM

## 2015-05-11 LAB — GLUCOSE, CAPILLARY
Glucose-Capillary: 104 mg/dL — ABNORMAL HIGH (ref 65–99)
Glucose-Capillary: 67 mg/dL (ref 65–99)

## 2015-05-11 MED ORDER — SENNOSIDES-DOCUSATE SODIUM 8.6-50 MG PO TABS
2.0000 | ORAL_TABLET | ORAL | Status: DC
  Administered 2015-05-12: 2 via ORAL

## 2015-05-11 MED ORDER — TETANUS-DIPHTH-ACELL PERTUSSIS 5-2.5-18.5 LF-MCG/0.5 IM SUSP: 0.5000 mL | Freq: Once | INTRAMUSCULAR | Status: DC

## 2015-05-11 MED ORDER — ACETAMINOPHEN 325 MG PO TABS: 650.0000 mg | ORAL_TABLET | ORAL | Status: DC | PRN

## 2015-05-11 MED ORDER — WITCH HAZEL-GLYCERIN EX PADS: 1.0000 "application " | MEDICATED_PAD | CUTANEOUS | Status: DC | PRN

## 2015-05-11 MED ORDER — IBUPROFEN 600 MG PO TABS
600.0000 mg | ORAL_TABLET | Freq: Four times a day (QID) | ORAL | Status: DC
  Administered 2015-05-11 – 2015-05-12 (×6): 600 mg via ORAL
  Filled 2015-05-11 (×5): qty 1

## 2015-05-11 MED ORDER — ZOLPIDEM TARTRATE 5 MG PO TABS: 5.0000 mg | ORAL_TABLET | Freq: Every evening | ORAL | Status: DC | PRN

## 2015-05-11 MED ORDER — DIPHENHYDRAMINE HCL 25 MG PO CAPS: 25.0000 mg | ORAL_CAPSULE | Freq: Four times a day (QID) | ORAL | Status: DC | PRN

## 2015-05-11 MED ORDER — SIMETHICONE 80 MG PO CHEW: 80.0000 mg | CHEWABLE_TABLET | ORAL | Status: DC | PRN

## 2015-05-11 MED ORDER — OXYCODONE-ACETAMINOPHEN 5-325 MG PO TABS: 2.0000 | ORAL_TABLET | ORAL | Status: DC | PRN

## 2015-05-11 MED ORDER — BENZOCAINE-MENTHOL 20-0.5 % EX AERO: 1.0000 "application " | INHALATION_SPRAY | CUTANEOUS | Status: DC | PRN

## 2015-05-11 MED ORDER — LIDOCAINE HCL (PF) 1 % IJ SOLN
INTRAMUSCULAR | Status: DC | PRN
  Administered 2015-05-11: 7 mL via EPIDURAL
  Administered 2015-05-11: 8 mL via EPIDURAL

## 2015-05-11 MED ORDER — ONDANSETRON HCL 4 MG PO TABS: 4.0000 mg | ORAL_TABLET | ORAL | Status: DC | PRN

## 2015-05-11 MED ORDER — ONDANSETRON HCL 4 MG/2ML IJ SOLN: 4.0000 mg | INTRAMUSCULAR | Status: DC | PRN

## 2015-05-11 MED ORDER — DIBUCAINE 1 % RE OINT
1.0000 | TOPICAL_OINTMENT | RECTAL | Status: DC | PRN
Start: 2015-05-11 — End: 2015-05-12

## 2015-05-11 MED ORDER — PRENATAL MULTIVITAMIN CH
1.0000 | ORAL_TABLET | Freq: Every day | ORAL | Status: DC
  Administered 2015-05-11 – 2015-05-12 (×2): 1 via ORAL
  Filled 2015-05-11 (×2): qty 1

## 2015-05-11 MED ORDER — LANOLIN HYDROUS EX OINT: TOPICAL_OINTMENT | CUTANEOUS | Status: DC | PRN

## 2015-05-11 MED ORDER — OXYCODONE-ACETAMINOPHEN 5-325 MG PO TABS: 1.0000 | ORAL_TABLET | ORAL | Status: DC | PRN

## 2015-05-11 NOTE — Progress Notes (Signed)
I stopped to ck on patient's need, I ordered her meals, by Orlan Leavens Spanish Interpreter

## 2015-05-11 NOTE — Progress Notes (Signed)
Labor Progress Note  S: Pt seen w/o RN at bedside; currently sleeping; did not awaken  O:  BP 120/66 mmHg  Pulse 74  Temp(Src) 98 F (36.7 C) (Oral)  Resp 18  Ht  (1.6 m)  Wt 114.76 kg (253 lb)  BMI 44.83 kg/m2  SpO2 97%  LMP 07/15/2014 Cat I CVE:  Dilation: 5 Effacement (%): 100 Cervical Position: Posterior Station: -2 Presentation: Vertex Exam by:: ansah-mensah, rnc    A&P: 39 y.o. [id number] [redacted]w[redacted]d pt now w/ AROM, IUPC, epidural making cervical change #pit is currently titrated to adequate contractility; will cont to monitor #expecting normal progression to vag delivery #q2hr checks  Lowanda Foster, MD 1:10 AM  I have seen and examined this patient and I agree with the above. Cam Hai CNM 2:41 AM 05/11/2015

## 2015-05-11 NOTE — Anesthesia Postprocedure Evaluation (Signed)
  Anesthesia Post-op Note  Patient: Jeanette [name]  Procedure(s) Performed: * No procedures listed *  Patient Location: PACU and Mother/Baby  Anesthesia Type:Epidural  Level of Consciousness: awake, alert  and oriented  Airway and Oxygen Therapy: Patient Spontanous Breathing  Post-op Pain: mild  Post-op Assessment: Post-op Vital signs reviewed, Patient's Cardiovascular Status Stable, Respiratory Function Stable, No signs of Nausea or vomiting, Adequate PO intake, Pain level controlled, No headache, No backache and Patient able to bend at knees              Post-op Vital Signs: Reviewed and stable  Last Vitals:  Filed Vitals:   05/11/15 0900  BP: 123/59  Pulse: 92  Temp: 36.5 C  Resp: 18    Complications: No apparent anesthesia complications

## 2015-05-11 NOTE — Anesthesia Procedure Notes (Signed)
Epidural Patient location during procedure: OB Start time: 05/11/2015 12:01 AM End time: 05/11/2015 12:06 AM  Staffing Anesthesiologist: Leilani Able Performed by: anesthesiologist   Preanesthetic Checklist Completed: patient identified, surgical consent, pre-op evaluation, timeout performed, IV checked, risks and benefits discussed and monitors and equipment checked  Epidural Patient position: sitting Prep: site prepped and draped and DuraPrep Patient monitoring: continuous pulse ox and blood pressure Approach: midline Location: L3-L4 Injection technique: LOR air  Needle:  Needle type: Tuohy  Needle gauge: 17 G Needle length: 9 cm and 9 Needle insertion depth: 7 cm Catheter type: closed end flexible Catheter size: 19 Gauge Catheter at skin depth: 12 cm Test dose: negative and Other  Assessment Sensory level: T10 Events: blood not aspirated, injection not painful, no injection resistance, negative IV test and no paresthesia  Additional Notes Reason for block:procedure for pain

## 2015-05-11 NOTE — Lactation Note (Signed)
This note was copied from the chart of Jeanette [name]. Lactation Consultation Note  Patient Name: Jeanette [name] Date: 05/11/2015 Reason for consult: Initial assessment Visited with Mom and FOB, baby 9 hrs old.  Mom states her baby latches well, without feeling any pain.  Latch scores of 9-10 were recorded on flow record.  Demonstrated manual breast expression, colostrum easily expressed.  Encouraged Mom to keep baby skin to skin, and feed baby often when he cues he is hungry.  Brochure left in room.  Informed of IP and OP lactation resources available to her.  Encouraged her to call prn for assistance, follow up in am.   Consult Status Consult Status: Follow-up Date: 05/12/15 Follow-up type: In-patient    [name] 05/11/2015, 11:34 AM

## 2015-05-12 LAB — GLUCOSE, CAPILLARY: Glucose-Capillary: 88 mg/dL (ref 65–99)

## 2015-05-12 MED ORDER — IBUPROFEN 600 MG PO TABS: 600.0000 mg | ORAL_TABLET | Freq: Four times a day (QID) | ORAL | Status: DC

## 2015-05-12 NOTE — Discharge Summary (Signed)
Obstetric Discharge Summary Reason for Admission: induction of labor/TOLAC A2/B DM and cHTN Prenatal Procedures: NST and ultrasound Intrapartum Procedures: spontaneous vaginal delivery Postpartum Procedures: none Complications-Operative and Postpartum: none   IOL for A2/B DM.  Went on to have an uncomplicated VBAC.  FBS's have been 88/104/67 (not on meds). Feels well and requests DC. Breastfeeding and plans IUD.  Already has a f/u appt next month.   HEMOGLOBIN  Date Value Ref Range Status  05/10/2015 11.6* 12.0 - 15.0 g/dL Final  69/62/9528 41.3 g/dL Final   HCT  Date Value Ref Range Status  05/10/2015 35.4* 36.0 - 46.0 % Final  11/14/2014 39 % Final  05/14/2011 35 % Final    Comment:    performed at [facility] Vitals:   05/12/15 0635  BP: 124/78  Pulse: 86  Temp: 98.4 F (36.9 C)  Resp: 18    Physical Exam:  General: alert, cooperative and no distress  Chest:  Normal respiratory effort Lochia: appropriate Uterine Fundus: firm Incision: n/a DVT Evaluation: No evidence of DVT seen on physical exam.  Discharge Diagnoses: Term Pregnancy-delivered  Discharge Information: Date: 05/12/2015 Activity: pelvic rest Diet: routine Medications: Ibuprofen Condition: stable Instructions: refer to practice specific booklet Discharge to: home Follow-up Information    Follow up with [facility].   Specialty:  Obstetrics and Gynecology   Why:  as scheduled in September   Contact information:   [address] [place] [address] [phone]      Newborn Data: Live born female  Birth Weight: 7 lb 14.6 oz (3590 g) APGAR: , 9  Home with mother.  Jeanette [name],Jeanette Jeanette [name] 05/12/2015, 8:04 AM

## 2015-05-12 NOTE — Discharge Instructions (Signed)

## 2015-05-15 ENCOUNTER — Encounter: Payer: Self-pay | Admitting: *Deleted

## 2015-05-24 ENCOUNTER — Encounter (HOSPITAL_COMMUNITY): Payer: Self-pay

## 2015-06-16 ENCOUNTER — Ambulatory Visit (INDEPENDENT_AMBULATORY_CARE_PROVIDER_SITE_OTHER): Payer: Self-pay | Admitting: Obstetrics and Gynecology

## 2015-06-16 ENCOUNTER — Encounter: Payer: Self-pay | Admitting: Obstetrics and Gynecology

## 2015-06-16 MED ORDER — NORETHINDRONE 0.35 MG PO TABS: 1.0000 | ORAL_TABLET | Freq: Every day | ORAL | Status: AC

## 2015-06-16 NOTE — Progress Notes (Signed)
CLINIC ENCOUNTER NOTE  History:  39 y.o. [id number] here today for PP visit.  Feeding: breastfeeding, no problems. No nipple or other breast problems. Giving formula as well per peds as weight gain not adequate.  Contraception: wants something that works with breastfeeding.   GDM: suspicion of pre-existing diabetes, pt says never diagnosed.   No more bleeding, no pain, no fever.   Hasn't resumed sex, wants to.   No crying, sadness, or depressed mood.   Past Medical History  Diagnosis Date  . Migraine headache 06/13/2011  . Hypertension     only with pregnancy  . Pregnancy induced hypertension   . Diabetes mellitus without complication   . Gestational diabetes     glyburide    Past Surgical History  Procedure Laterality Date  . Cesarean section  2004    The following portions of the patient's history were reviewed and updated as appropriate: allergies, current medications, past family history, past medical history, past social history, past surgical history and problem list.   Health Maintenance:  Normal pap 11/2014  Review of Systems:  See above; comprehensive review of systems was otherwise negative.  Objective:  Physical Exam BP 120/68 mmHg  Pulse 80  Temp(Src) 98 F (36.7 C)  Ht  (1.6 m)  Wt 233 lb 6.4 oz (105.87 kg)  BMI 41.36 kg/m2  Breastfeeding? Yes CONSTITUTIONAL: Well-developed, well-nourished female in no acute distress.  HENT:  Normocephalic, atraumatic SKIN: Skin is warm and dry.  NEUROLGIC: Alert  PSYCHIATRIC: Normal mood and affect.  CARDIOVASCULAR: Normal heart rate noted RESPIRATORY: Effort and breath sounds normal, no problems with respiration noted ABDOMEN: Soft, no distention noted.  No tenderness, rebound or guarding.  PELVIC: Deferred   Labs and Imaging No results found.  Assessment & Plan:   # Postpartum visit - breastfeeding support provided - no symptoms PPD - too soon for GTT, will return in 2 weeks for that - POPs for  contraception, HD for LARC  Routine preventative health maintenance measures emphasized.     Nathanuel Cabreja B. Daina Cara, MD OB/GYN Fellow [facility] for [employer], [facility]

## 2015-06-26 ENCOUNTER — Other Ambulatory Visit: Payer: Self-pay

## 2015-06-26 DIAGNOSIS — Q8789 Other specified congenital malformation syndromes, not elsewhere classified: Secondary | ICD-10-CM

## 2015-06-26 DIAGNOSIS — O24429 Gestational diabetes mellitus in childbirth, unspecified control: Secondary | ICD-10-CM

## 2015-06-27 LAB — GLUCOSE TOLERANCE, 2 HOURS
GLUCOSE, FASTING: 86 mg/dL (ref 65–99)
Glucose, 2 hour: 74 mg/dL (ref 70–139)

## 2015-06-29 ENCOUNTER — Telehealth: Payer: Self-pay

## 2015-06-29 NOTE — Telephone Encounter (Signed)
Per Dr. Adrian Blackwater, pt 2 hr is normal and to f/u with PCP with routine diabetes screening.  Informed pt with Spanish interpreter Lorinda Creed and asked pt if she has a PCP and she stated "that she is being seen in [facility]."  I explained to the pt that she can continue to see them for her PCP concerns.  Pt stated understanding.

## 2015-07-17 ENCOUNTER — Telehealth: Payer: Self-pay | Admitting: *Deleted

## 2015-07-17 NOTE — Telephone Encounter (Signed)
Pt contacted the clinic, left message in Spanish.  Attempted to contact patient with Spanish Interpreter, Delphina Cahill, no answer.  Left message for patient to return call to the clinic.

## 2015-07-17 NOTE — Telephone Encounter (Signed)
Jeanette [name] called back to front desk and call transferred to nurse.  Used interpreter [employer]. Patient c/o vaginal delivery 8;/4/16 , breast./bottle feeding, then started birth control pill 07/07/15. States she started period 07/14/15 and still bleeding, states periods don't usually last longer than 3 days.  We discussed since she is on micronor important to take as close to same time every day, also that it is normal to have irregular bleeding while body adjusts.  Most likely by 2nd or 3rd pack will have less bleeding. Also advised to continue using condoms while on first pack, she states she used condoms for previous 2 week before starting pill.

## 2015-12-12 IMAGING — US US OB DETAIL+14 WK
1 series · 12 of 28 positions shown · non-contrast
Comparison: none

[Series 1: us ob detail+14 wk · 0.21mm/px · 12 of 32 slices shown]
[im 2/32]
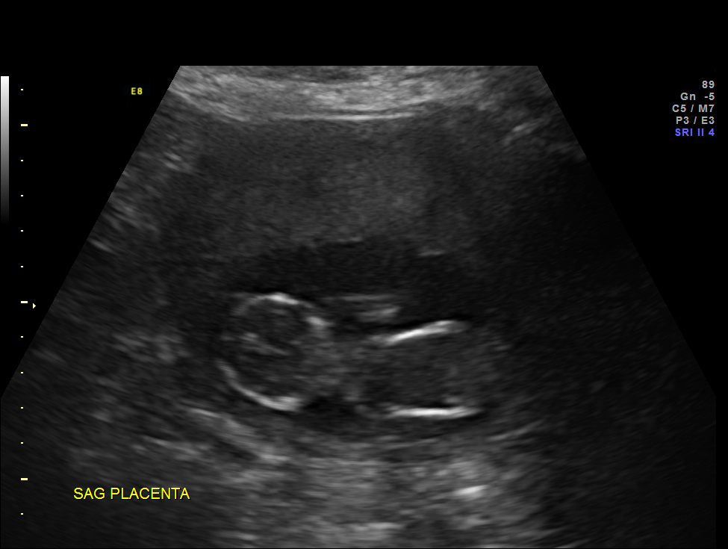
[im 4/32]
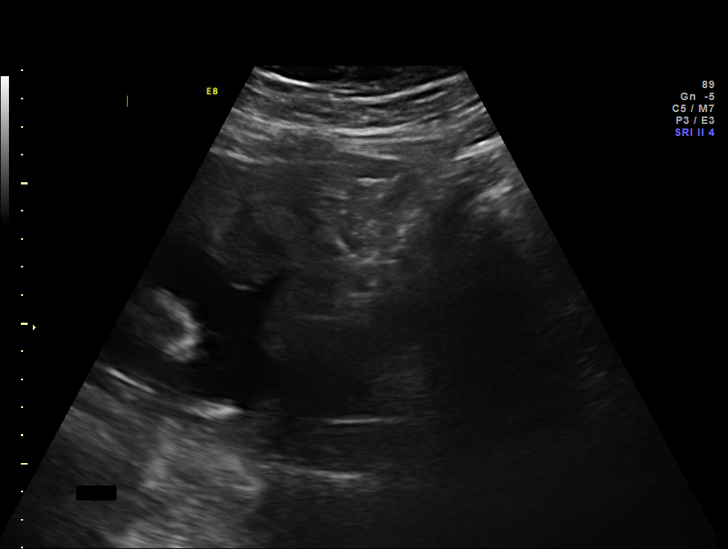
[im 6/32]
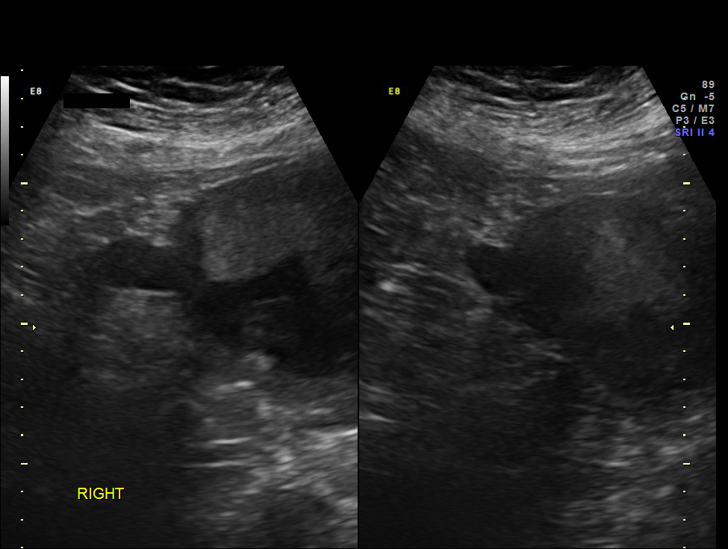
[im 10/32]
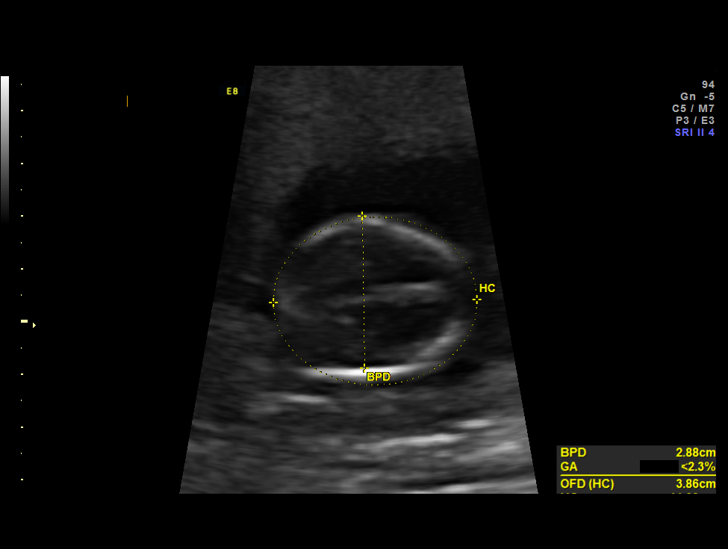
[im 12/32]
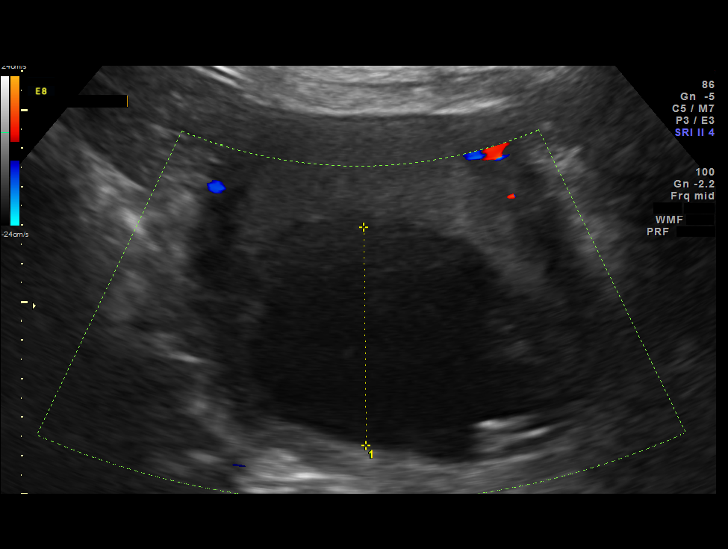
[im 14/32]
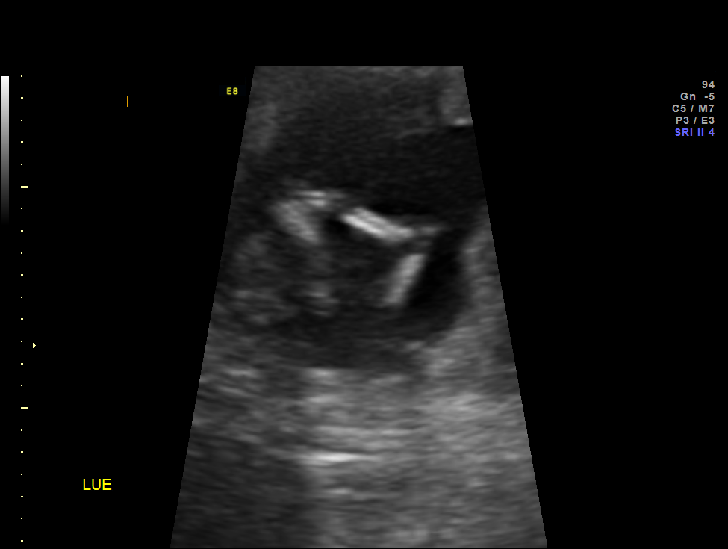
[im 18/32]
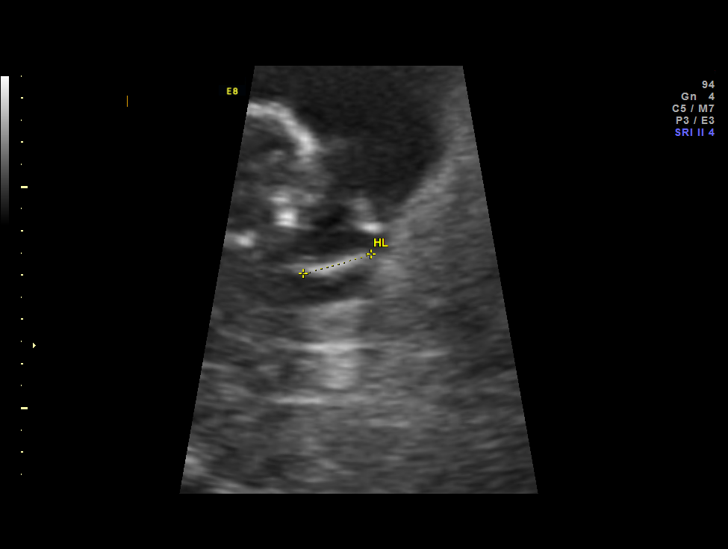
[im 20/32]
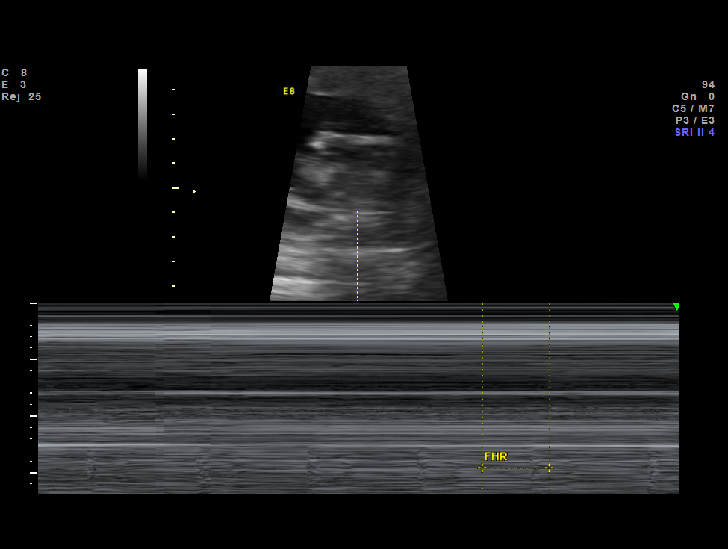
[im 22/32]
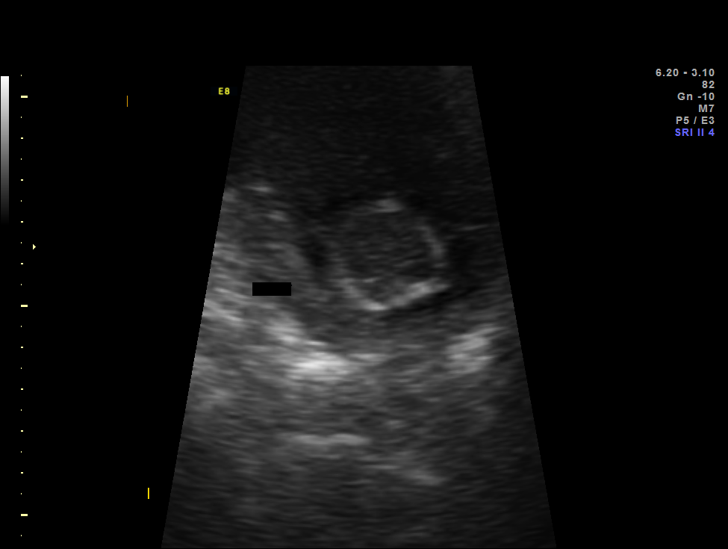
[im 26/32]
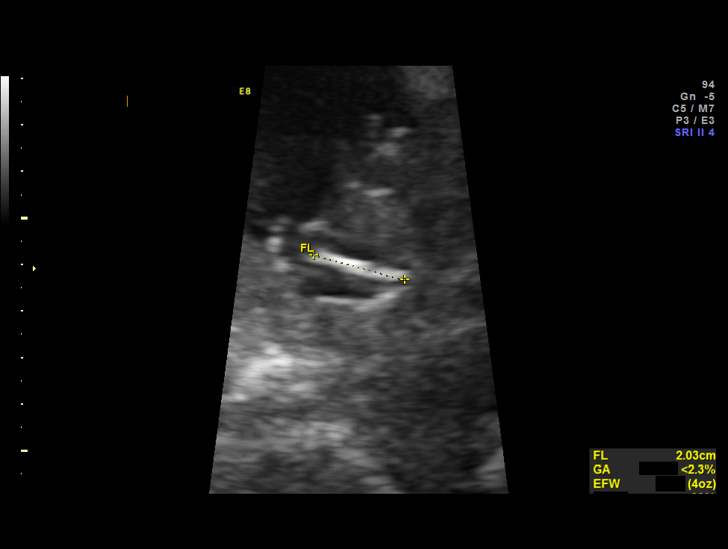
[im 28/32]
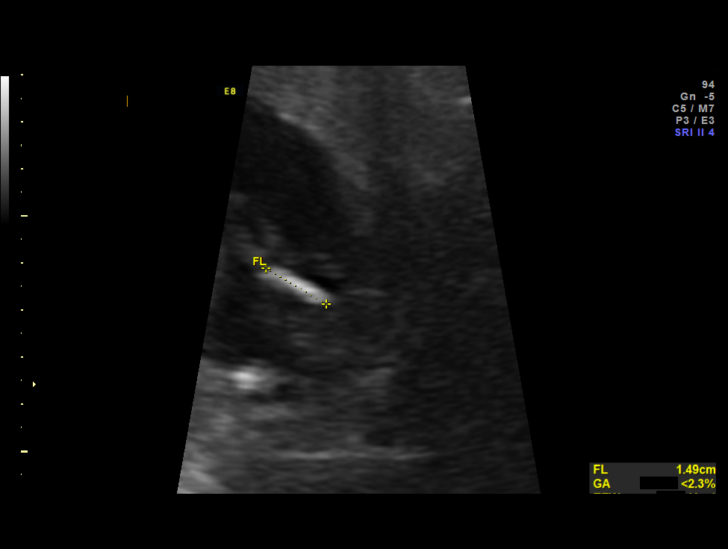
[im 30/32]
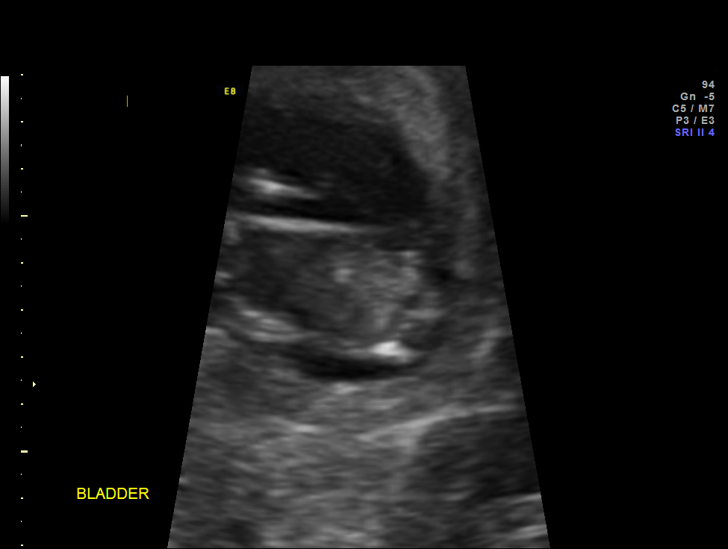

[12 of 28 positions shown; findings below may reference images not displayed]

OBSTETRICS REPORT
                      (Signed Final 11/22/2014 [DATE])

Service(s) Provided

 US OB DETAIL + 14 WK                                  76811.0
Indications

 Detailed fetal anatomic survey                        Z36
 Obesity complicating pregnancy
 Advanced maternal age multigravida 38, second
 trimester
 Poor obstetric history: Previous gestational HTN
 Previous cesarean section
 History of genetic / anatomic abnormality - (prior
 child with hydrocephalus)
 14 weeks gestation of pregnancy
Fetal Evaluation

 Num Of Fetuses:    1
 Fetal Heart Rate:  150                          bpm
 Cardiac Activity:  Observed
 Presentation:      Breech
 Placenta:          Anterior
 P. Cord            Not well visualized
 Insertion:

 Amniotic Fluid
 AFI FV:      Subjectively within normal limits
                                             Larg Pckt:    5.68  cm
Biometry

 BPD:       29  mm     G. Age:  15w 1d                CI:         77.1   70 - 86
 OFD:     37.6  mm                                    FL/HC:      13.5   15.3 -

 HC:     107.8  mm     G. Age:  15w 1d                HC/AC:      1.23   1.05 -

 AC:      87.6  mm     G. Age:  15w 0d                FL/BPD:
 FL:      14.6  mm     G. Age:  14w 2d                FL/AC:      16.7   20 - 24
 HUM:     15.6  mm     G. Age:  14w 3d

 Est. FW:     105  gm      0 lb 4 oz
Gestational Age

 LMP:           18w 4d        Date:  07/15/14                 EDD:   04/21/15
 U/S Today:     14w 6d                                        EDD:   05/17/15
 Best:          14w 6d     Det. By:  U/S (11/22/14)           EDD:   05/17/15
Anatomy

 Cranium:          Appears normal         Aortic Arch:      Not well visualized
 Fetal Cavum:      Not well visualized    Ductal Arch:      Not well visualized
 Ventricles:       Not well visualized    Diaphragm:        Not well visualized
 Choroid Plexus:   Not well visualized    Stomach:          Appears normal, left
                                                            sided
 Cerebellum:       Not well visualized    Abdomen:          Not well visualized
 Posterior Fossa:  Not well visualized    Abdominal Wall:   Not well visualized
 Nuchal Fold:      Not well visualized    Cord Vessels:     Not well visualized
 Face:             Not well visualized    Kidneys:          Not well visualized
 Lips:             Not well visualized    Bladder:          Appears normal
 Heart:            Not well visualized    Spine:            Not well visualized
 RVOT:             Not well visualized    Lower             Not well visualized
                                          Extremities:
 LVOT:             Not well visualized    Upper             Appears normal
                                          Extremities:

 Other:  Technically difficult due to  maternal habitus and early GA.
Cervix Uterus Adnexa

 Cervical Length:    3.16     cm

 Cervix:       Normal appearance by transabdominal scan.
 Uterus:       No abnormality visualized.
 Cul De Sac:   No free fluid seen.
 Left Ovary:    Not visualized.
 Right Ovary:   Within normal limits.

 Adnexa:     No abnormality visualized.
Comments

 The patient's fetal anatomic survey is not complete due to
 early gestational age.  However, no gross fetal anomalies
 were identified.

 The patient also had genetics counseling today.  For full
 details of that visit please see accompanying documentation.
 The patient decided to forgo invasive testing.

 Given that her pregnancy dating has been changed, her
 second trimester screen was drawn too early.  It should be
 repeated prior to 21 weeks.
Impression

 Single living intrauterine pregnancy at 14 weeks 6 days.
 Biometry is not consistent with EDC based on LMP.
 Pregnancy should be dated by today's ultrasound.
 Appropriate fetal growth.
 Normal amniotic fluid volume.
 No gross fetal anomalies identified.
Recommendations

 Recommend follow-up ultrasound examination in 3 weeks to
 reassess fetal growth and anatomy.
                Jang, Naseem
